# Patient Record
Sex: Female | Born: 1990 | Race: Black or African American | Hispanic: No | Marital: Single | Smoking: Never smoker
Health system: Southern US, Community
[De-identification: ages and names within clinical notes are randomized; demographics above are authoritative.]

## PROBLEM LIST (undated history)

## (undated) DIAGNOSIS — N920 Excessive and frequent menstruation with regular cycle: Secondary | ICD-10-CM

## (undated) HISTORY — DX: Excessive and frequent menstruation with regular cycle: N92.0

## (undated) HISTORY — PX: APPENDECTOMY: SHX54

---

## 2012-03-30 LAB — HM PAP SMEAR

## 2013-04-02 ENCOUNTER — Encounter: Payer: Self-pay | Admitting: Nurse Practitioner

## 2013-04-02 ENCOUNTER — Ambulatory Visit: Payer: Self-pay | Admitting: Nurse Practitioner

## 2013-04-02 DIAGNOSIS — Z01419 Encounter for gynecological examination (general) (routine) without abnormal findings: Secondary | ICD-10-CM

## 2013-05-09 ENCOUNTER — Encounter (HOSPITAL_COMMUNITY): Payer: Self-pay | Admitting: Emergency Medicine

## 2013-05-09 ENCOUNTER — Emergency Department (HOSPITAL_COMMUNITY)
Admission: EM | Admit: 2013-05-09 | Discharge: 2013-05-10 | Disposition: A | Discharge status: Home / Self Care | Payer: BC Managed Care – PPO | Attending: Emergency Medicine | Admitting: Emergency Medicine

## 2013-05-09 ENCOUNTER — Emergency Department (HOSPITAL_COMMUNITY): Payer: BC Managed Care – PPO

## 2013-05-09 DIAGNOSIS — Z9089 Acquired absence of other organs: Secondary | ICD-10-CM | POA: Insufficient documentation

## 2013-05-09 DIAGNOSIS — Z3202 Encounter for pregnancy test, result negative: Secondary | ICD-10-CM | POA: Insufficient documentation

## 2013-05-09 DIAGNOSIS — R195 Other fecal abnormalities: Secondary | ICD-10-CM | POA: Insufficient documentation

## 2013-05-09 DIAGNOSIS — R109 Unspecified abdominal pain: Secondary | ICD-10-CM

## 2013-05-09 DIAGNOSIS — Z91040 Latex allergy status: Secondary | ICD-10-CM | POA: Insufficient documentation

## 2013-05-09 DIAGNOSIS — Z8742 Personal history of other diseases of the female genital tract: Secondary | ICD-10-CM | POA: Insufficient documentation

## 2013-05-09 DIAGNOSIS — R1013 Epigastric pain: Secondary | ICD-10-CM | POA: Insufficient documentation

## 2013-05-09 DIAGNOSIS — Z79899 Other long term (current) drug therapy: Secondary | ICD-10-CM | POA: Insufficient documentation

## 2013-05-09 LAB — CBC WITH DIFFERENTIAL/PLATELET
Basophils Absolute: 0 10*3/uL (ref 0.0–0.1)
Basophils Relative: 1 % (ref 0–1)
Eosinophils Absolute: 0.2 10*3/uL (ref 0.0–0.7)
Eosinophils Relative: 3 % (ref 0–5)
HCT: 41.9 % (ref 36.0–46.0)
Hemoglobin: 14.4 g/dL (ref 12.0–15.0)
Lymphocytes Relative: 48 % — ABNORMAL HIGH (ref 12–46)
Lymphs Abs: 2.9 10*3/uL (ref 0.7–4.0)
MCH: 29.6 pg (ref 26.0–34.0)
MCHC: 34.4 g/dL (ref 30.0–36.0)
MCV: 86 fL (ref 78.0–100.0)
Monocytes Absolute: 0.4 10*3/uL (ref 0.1–1.0)
Monocytes Relative: 7 % (ref 3–12)
Neutro Abs: 2.5 10*3/uL (ref 1.7–7.7)
Neutrophils Relative %: 41 % — ABNORMAL LOW (ref 43–77)
Platelets: 258 10*3/uL (ref 150–400)
RBC: 4.87 MIL/uL (ref 3.87–5.11)
RDW: 12.6 % (ref 11.5–15.5)
WBC: 6 10*3/uL (ref 4.0–10.5)

## 2013-05-09 LAB — COMPREHENSIVE METABOLIC PANEL
ALT: 19 U/L (ref 0–35)
AST: 30 U/L (ref 0–37)
Albumin: 4.6 g/dL (ref 3.5–5.2)
Alkaline Phosphatase: 68 U/L (ref 39–117)
BUN: 17 mg/dL (ref 6–23)
CO2: 25 mEq/L (ref 19–32)
Calcium: 10.3 mg/dL (ref 8.4–10.5)
Chloride: 100 mEq/L (ref 96–112)
Creatinine, Ser: 0.81 mg/dL (ref 0.50–1.10)
GFR calc Af Amer: >90 mL/min (ref >90)
GFR calc non Af Amer: >90 mL/min (ref >90)
Glucose, Bld: 75 mg/dL (ref 70–99)
Potassium: 4.3 mEq/L (ref 3.5–5.1)
Sodium: 137 mEq/L (ref 135–145)
Total Bilirubin: 0.2 mg/dL — ABNORMAL LOW (ref 0.3–1.2)
Total Protein: 8.3 g/dL (ref 6.0–8.3)

## 2013-05-09 LAB — URINALYSIS, ROUTINE W REFLEX MICROSCOPIC
Bilirubin Urine: NEGATIVE
Glucose, UA: NEGATIVE mg/dL
Hgb urine dipstick: NEGATIVE
Ketones, ur: NEGATIVE mg/dL
Leukocytes, UA: NEGATIVE
Nitrite: NEGATIVE
Protein, ur: NEGATIVE mg/dL
Specific Gravity, Urine: 1.017 (ref 1.005–1.030)
Urobilinogen, UA: 0.2 mg/dL (ref 0.0–1.0)
pH: 6 (ref 5.0–8.0)

## 2013-05-09 LAB — POCT PREGNANCY, URINE: Preg Test, Ur: NEGATIVE

## 2013-05-09 LAB — LIPASE, BLOOD: Lipase: 50 U/L (ref 11–59)

## 2013-05-09 MED ORDER — SODIUM CHLORIDE 0.9 % IV SOLN
80.0000 mg | Freq: Once | INTRAVENOUS | Status: AC
  Administered 2013-05-09: 23:00:00 80 mg via INTRAVENOUS
  Filled 2013-05-09: qty 80

## 2013-05-09 MED ORDER — GI COCKTAIL ~~LOC~~
30.0000 mL | Freq: Once | ORAL | Status: AC
  Administered 2013-05-09: 30 mL via ORAL
  Filled 2013-05-09: qty 30

## 2013-05-09 MED ORDER — FAMOTIDINE IN NACL 20-0.9 MG/50ML-% IV SOLN
20.0000 mg | Freq: Once | INTRAVENOUS | Status: AC
  Administered 2013-05-09: 20 mg via INTRAVENOUS
  Filled 2013-05-09: qty 50

## 2013-05-09 NOTE — ED Provider Notes (Signed)
CSN: [id number]     Arrival date & time 05/09/13  2124 History   First MD Initiated Contact with Patient 05/09/13 2143     Chief Complaint  Patient presents with  . Abdominal Pain   (Consider location/radiation/quality/duration/timing/severity/associated sxs/prior Treatment) HPI This 22 year old female is 3 days of intermittent epigastric pain, several times per hour during the daytime and nighttime she will have some intermittent sharp stabbing burning epigastric pains lasting less than a minute at a time but has multiple episodes per hour, and she will try an antacid and she will have no pain whatsoever for a few hours, she's had some very dark stools for the last few days as well, she has only mild epigastric pain now within the last hour, she is no nausea vomiting diarrhea or grossly bloody stools, she is no dysuria or vaginal discharge, she is on her menstrual cycle which is a normal period for her. She has had prior appendectomy. She has no lower abdominal pain. She is no trauma fever rash chest pain shortness breath. She is no treatment prior to arrival other than over-the-counter antacids which has helped the pain temporarily.The triage note had confusion written down his complaint the patient states she really has had no confusion. Past Medical History  Diagnosis Date  . Menorrhagia    Past Surgical History  Procedure Laterality Date  . Appendectomy     Family History  Problem Relation Age of Onset  . Asthma Mother    History  Substance Use Topics  . Smoking status: Never Smoker   . Smokeless tobacco: Never Used  . Alcohol Use: No   OB History   Grav Para Term Preterm Abortions TAB SAB Ect Mult Living   0 0 0 0 0 0 0 0 0 0      Review of Systems 10 Systems reviewed and are negative for acute change except as noted in the HPI. Allergies  Latex  Home Medications   Current Outpatient Rx  Name  Route  Sig  Dispense  Refill  . omeprazole (PRILOSEC) 20 MG capsule    Oral   Take 1 capsule (20 mg total) by mouth daily.   15 capsule   0    BP 104/68  Pulse 69  Temp(Src) 98.5 F (36.9 C) (Oral)  Resp 18  SpO2 100%  LMP 05/09/2013 Physical Exam  Nursing note and vitals reviewed. Constitutional:  Awake, alert, nontoxic appearance.  HENT:  Head: Atraumatic.  Eyes: Right eye exhibits no discharge. Left eye exhibits no discharge.  Neck: Neck supple.  Cardiovascular: Normal rate and regular rhythm.   No murmur heard. Pulmonary/Chest: Effort normal and breath sounds normal. No respiratory distress. She has no wheezes. She has no rales. She exhibits no tenderness.  Abdominal: Soft. Bowel sounds are normal. She exhibits no distension and no mass. There is tenderness. There is no rebound and no guarding.  Minimal epigastric tenderness only the rest of the abdomen is nontender, there is no CVA tenderness  Musculoskeletal: She exhibits no tenderness.  Baseline ROM, no obvious new focal weakness.  Neurological:  Mental status and motor strength appears baseline for patient and situation.  Skin: No rash noted.  Psychiatric: She has a normal mood and affect.    ED Course  Procedures (including critical care time) Pt stable in ED with no significant deterioration in condition.Patient / Family / Caregiver informed of clinical course, understand medical decision-making process, and agree with plan. Labs Review Labs Reviewed  CBC WITH DIFFERENTIAL -  Abnormal; Notable for the following:    Neutrophils Relative % 41 (*)    Lymphocytes Relative 48 (*)    All other components within normal limits  COMPREHENSIVE METABOLIC PANEL - Abnormal; Notable for the following:    Total Bilirubin 0.2 (*)    All other components within normal limits  LIPASE, BLOOD  URINALYSIS, ROUTINE W REFLEX MICROSCOPIC  POCT PREGNANCY, URINE  OCCULT BLOOD, POC DEVICE   Imaging Review No results found.  EKG Interpretation   None       MDM   1. Abdominal pain    I doubt  any other EMC precluding discharge at this time including, but not necessarily limited to the following:SBI, peritonitis.    Hurman Horn, MD 05/12/13 1630

## 2013-05-09 NOTE — ED Notes (Signed)
Pt states she has been having abdominal pain x3 days and is having dark stools, back pain with slight confusion. Pt reports when pain in abdomen occurs "it feels like a pulling then release sensation."

## 2013-05-10 LAB — OCCULT BLOOD, POC DEVICE: Fecal Occult Bld: NEGATIVE

## 2013-05-10 MED ORDER — OMEPRAZOLE 20 MG PO CPDR: 20.0000 mg | DELAYED_RELEASE_CAPSULE | Freq: Every day | ORAL | Status: DC

## 2013-05-19 ENCOUNTER — Other Ambulatory Visit: Payer: Self-pay | Admitting: Obstetrics and Gynecology

## 2013-06-25 ENCOUNTER — Encounter: Payer: Self-pay | Admitting: Certified Nurse Midwife

## 2013-06-25 ENCOUNTER — Ambulatory Visit (INDEPENDENT_AMBULATORY_CARE_PROVIDER_SITE_OTHER): Payer: BC Managed Care – PPO | Admitting: Certified Nurse Midwife

## 2013-06-25 VITALS — BP 106/64 | HR 64 | Resp 16 | Ht 66.5 in | Wt 133.0 lb

## 2013-06-25 DIAGNOSIS — B3731 Acute candidiasis of vulva and vagina: Secondary | ICD-10-CM

## 2013-06-25 DIAGNOSIS — N760 Acute vaginitis: Secondary | ICD-10-CM

## 2013-06-25 DIAGNOSIS — B373 Candidiasis of vulva and vagina: Secondary | ICD-10-CM

## 2013-06-25 DIAGNOSIS — Z91040 Latex allergy status: Secondary | ICD-10-CM

## 2013-06-25 MED ORDER — METRONIDAZOLE 0.75 % VA GEL: 1.0000 | Freq: Every day | VAGINAL | Status: DC

## 2013-06-25 MED ORDER — NYSTATIN-TRIAMCINOLONE 100000-0.1 UNIT/GM-% EX CREA: 1.0000 "application " | TOPICAL_CREAM | Freq: Two times a day (BID) | CUTANEOUS | Status: DC

## 2013-06-25 NOTE — Patient Instructions (Signed)
Bacterial Vaginosis Bacterial vaginosis (BV) is a vaginal infection where the normal balance of bacteria in the vagina is disrupted. The normal balance is then replaced by an overgrowth of certain bacteria. There are several different kinds of bacteria that can cause BV. BV is the most common vaginal infection in women of childbearing age. CAUSES   The cause of BV is not fully understood. BV develops when there is an increase or imbalance of harmful bacteria.  Some activities or behaviors can upset the normal balance of bacteria in the vagina and put women at increased risk including:  Having a new sex partner or multiple sex partners.  Douching.  Using an intrauterine device (IUD) for contraception.  It is not clear what role sexual activity plays in the development of BV. However, women that have never had sexual intercourse are rarely infected with BV. Women do not get BV from toilet seats, bedding, swimming pools or from touching objects around them.  SYMPTOMS   Grey vaginal discharge.  A fish-like odor with discharge, especially after sexual intercourse.  Itching or burning of the vagina and vulva.  Burning or pain with urination.  Some women have no signs or symptoms at all. DIAGNOSIS  Your caregiver must examine the vagina for signs of BV. Your caregiver will perform lab tests and look at the sample of vaginal fluid through a microscope. They will look for bacteria and abnormal cells (clue cells), a pH test higher than 4.5, and a positive amine test all associated with BV.  RISKS AND COMPLICATIONS   Pelvic inflammatory disease (PID).  Infections following gynecology surgery.  Developing HIV.  Developing herpes virus. TREATMENT  Sometimes BV will clear up without treatment. However, all women with symptoms of BV should be treated to avoid complications, especially if gynecology surgery is planned. Female partners generally do not need to be treated. However, BV may spread  between female sex partners so treatment is helpful in preventing a recurrence of BV.   BV may be treated with antibiotics. The antibiotics come in either pill or vaginal cream forms. Either can be used with nonpregnant or pregnant women, but the recommended dosages differ. These antibiotics are not harmful to the baby.  BV can recur after treatment. If this happens, a second round of antibiotics will often be prescribed.  Treatment is important for pregnant women. If not treated, BV can cause a premature delivery, especially for a pregnant woman who had a premature birth in the past. All pregnant women who have symptoms of BV should be checked and treated.  For chronic reoccurrence of BV, treatment with a type of prescribed gel vaginally twice a week is helpful. HOME CARE INSTRUCTIONS   Finish all medication as directed by your caregiver.  Do not have sex until treatment is completed.  Tell your sexual partner that you have a vaginal infection. They should see their caregiver and be treated if they have problems, such as a mild rash or itching.  Practice safe sex. Use condoms. Only have 1 sex partner. PREVENTION  Basic prevention steps can help reduce the risk of upsetting the natural balance of bacteria in the vagina and developing BV:  Do not have sexual intercourse (be abstinent).  Do not douche.  Use all of the medicine prescribed for treatment of BV, even if the signs and symptoms go away.  Tell your sex partner if you have BV. That way, they can be treated, if needed, to prevent reoccurrence. SEEK MEDICAL CARE IF:     Your symptoms are not improving after 3 days of treatment.  You have increased discharge, pain, or fever. MAKE SURE YOU:   Understand these instructions.  Will watch your condition.  Will get help right away if you are not doing well or get worse. FOR MORE INFORMATION  Division of STD Prevention (DSTDP), Centers for Disease Control and Prevention:  www.cdc.gov/std American Social Health Association (ASHA): www.ashastd.org  Document Released: 06/03/2005 Document Revised: 08/26/2011 Document Reviewed: 01/13/2013 Exit[employer] Patient Information 2014 [employer].  

## 2013-06-25 NOTE — Progress Notes (Signed)
23 y.o.[place] female [place] with a 7 day(s) history of the following:urinary symptoms of none and vulvar itching Sexually active: yes Last sexual activity:6days ago. Pt also reports the following associated symptoms: none Patient hastried over the counter treatment with minimal relief. Patient has been using Vagisil cream with some response. No new personal products, but had not been sexually active in a while and used condom. Denies STD concern or testing needed.  O: Healthy female WDWN Affect normal     Exam:  KNL:[id number], Urethra, Skene's normal, increase pink noted on vulva with exudate, non tender, no lesions noted.  wet prep taken                Vag:no lesions, discharge: copious, watery and malodorous, pH 5.0, wet prep done                Cx:  normal appearance and non tender                Uterus:non-tender, normal shape and consistency                Adnexa: normal adnexa and no mass, fullness, tenderness  Wet Prep shows:positive for yeast on vulva only, positive for BV vaginally, negative for trich   A: BV Yeast vulvitis ?latex allergy response to condom  P: Reviewed findings of BV and may be response to latex use also. Not considered sexually transmitted disease. Rx Metrogel see order Rx Mycolog see order Discussed use of non latex condoms to prevent severe allergic response.  Symptomatic local care discussed. Scientist, clinical (histocompatibility and immunogenetics) distributed.  RV prn

## 2013-06-28 NOTE — Progress Notes (Signed)
Reviewed personally.  M. Suzanne Noris Kulinski, MD.  

## 2014-05-30 ENCOUNTER — Encounter (HOSPITAL_COMMUNITY): Payer: Self-pay | Admitting: Emergency Medicine

## 2014-05-30 ENCOUNTER — Emergency Department (HOSPITAL_COMMUNITY)
Admission: EM | Admit: 2014-05-30 | Discharge: 2014-05-30 | Disposition: A | Discharge status: Home / Self Care | Payer: BC Managed Care – PPO | Attending: Emergency Medicine | Admitting: Emergency Medicine

## 2014-05-30 DIAGNOSIS — Z8742 Personal history of other diseases of the female genital tract: Secondary | ICD-10-CM | POA: Insufficient documentation

## 2014-05-30 DIAGNOSIS — G8929 Other chronic pain: Secondary | ICD-10-CM | POA: Insufficient documentation

## 2014-05-30 DIAGNOSIS — Y9389 Activity, other specified: Secondary | ICD-10-CM | POA: Diagnosis not present

## 2014-05-30 DIAGNOSIS — S0990XA Unspecified injury of head, initial encounter: Secondary | ICD-10-CM | POA: Insufficient documentation

## 2014-05-30 DIAGNOSIS — Z91040 Latex allergy status: Secondary | ICD-10-CM | POA: Insufficient documentation

## 2014-05-30 DIAGNOSIS — S199XXA Unspecified injury of neck, initial encounter: Secondary | ICD-10-CM | POA: Diagnosis present

## 2014-05-30 DIAGNOSIS — Y998 Other external cause status: Secondary | ICD-10-CM | POA: Insufficient documentation

## 2014-05-30 DIAGNOSIS — Y92410 Unspecified street and highway as the place of occurrence of the external cause: Secondary | ICD-10-CM | POA: Insufficient documentation

## 2014-05-30 DIAGNOSIS — S3992XA Unspecified injury of lower back, initial encounter: Secondary | ICD-10-CM | POA: Diagnosis not present

## 2014-05-30 MED ORDER — NAPROXEN 500 MG PO TABS: 500.0000 mg | ORAL_TABLET | Freq: Two times a day (BID) | ORAL | Status: DC

## 2014-05-30 MED ORDER — METHOCARBAMOL 500 MG PO TABS: 500.0000 mg | ORAL_TABLET | Freq: Two times a day (BID) | ORAL | Status: DC
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
## 2014-06-07 ENCOUNTER — Emergency Department (INDEPENDENT_AMBULATORY_CARE_PROVIDER_SITE_OTHER)
Admission: EM | Admit: 2014-06-07 | Discharge: 2014-06-07 | Disposition: A | Discharge status: Home / Self Care | Payer: Self-pay | Source: Home / Self Care | Attending: Family Medicine | Admitting: Family Medicine

## 2014-06-07 ENCOUNTER — Encounter (HOSPITAL_COMMUNITY): Payer: Self-pay | Admitting: Emergency Medicine

## 2014-06-07 DIAGNOSIS — Z041 Encounter for examination and observation following transport accident: Secondary | ICD-10-CM

## 2014-06-07 MED ORDER — MELOXICAM 7.5 MG PO TABS: 7.5000 mg | ORAL_TABLET | Freq: Two times a day (BID) | ORAL | Status: DC

## 2014-06-07 MED ORDER — METAXALONE 800 MG PO TABS: 800.0000 mg | ORAL_TABLET | Freq: Three times a day (TID) | ORAL | Status: DC
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
## 2014-06-07 NOTE — Discharge Instructions (Signed)
Heat and medicine and care as advised, see orthopedist if further problems.

## 2014-07-01 ENCOUNTER — Encounter: Payer: Self-pay | Admitting: Certified Nurse Midwife

## 2014-08-11 ENCOUNTER — Encounter: Payer: Self-pay | Admitting: Certified Nurse Midwife

## 2014-09-15 ENCOUNTER — Encounter (HOSPITAL_COMMUNITY): Payer: Self-pay | Admitting: Emergency Medicine

## 2014-09-15 ENCOUNTER — Emergency Department (HOSPITAL_COMMUNITY)
Admission: EM | Admit: 2014-09-15 | Discharge: 2014-09-15 | Disposition: A | Discharge status: Home / Self Care | Payer: BLUE CROSS/BLUE SHIELD | Attending: Emergency Medicine | Admitting: Emergency Medicine

## 2014-09-15 DIAGNOSIS — Z23 Encounter for immunization: Secondary | ICD-10-CM | POA: Insufficient documentation

## 2014-09-15 DIAGNOSIS — Z791 Long term (current) use of non-steroidal anti-inflammatories (NSAID): Secondary | ICD-10-CM | POA: Diagnosis not present

## 2014-09-15 DIAGNOSIS — Z8742 Personal history of other diseases of the female genital tract: Secondary | ICD-10-CM | POA: Insufficient documentation

## 2014-09-15 DIAGNOSIS — S61451A Open bite of right hand, initial encounter: Secondary | ICD-10-CM | POA: Insufficient documentation

## 2014-09-15 DIAGNOSIS — Z79899 Other long term (current) drug therapy: Secondary | ICD-10-CM | POA: Insufficient documentation

## 2014-09-15 DIAGNOSIS — Y9389 Activity, other specified: Secondary | ICD-10-CM | POA: Insufficient documentation

## 2014-09-15 DIAGNOSIS — S61452A Open bite of left hand, initial encounter: Secondary | ICD-10-CM | POA: Diagnosis not present

## 2014-09-15 DIAGNOSIS — Y998 Other external cause status: Secondary | ICD-10-CM | POA: Diagnosis not present

## 2014-09-15 DIAGNOSIS — W540XXA Bitten by dog, initial encounter: Secondary | ICD-10-CM | POA: Diagnosis not present

## 2014-09-15 DIAGNOSIS — Z91040 Latex allergy status: Secondary | ICD-10-CM | POA: Insufficient documentation

## 2014-09-15 DIAGNOSIS — Y929 Unspecified place or not applicable: Secondary | ICD-10-CM | POA: Insufficient documentation

## 2014-09-15 MED ORDER — AMOXICILLIN-POT CLAVULANATE 875-125 MG PO TABS: 1.0000 | ORAL_TABLET | Freq: Two times a day (BID) | ORAL | Status: DC

## 2014-09-15 MED ORDER — TETANUS-DIPHTH-ACELL PERTUSSIS 5-2.5-18.5 LF-MCG/0.5 IM SUSP
0.5000 mL | Freq: Once | INTRAMUSCULAR | Status: AC
  Administered 2014-09-15: 0.5 mL via INTRAMUSCULAR
  Filled 2014-09-15: qty 0.5

## 2014-09-15 NOTE — ED Notes (Signed)
Patient here after being bitten by her personal dog. States that she was attempting to bath him and he snapped at her twice injuring both her left and right hands. Patient has small laceration between pointer and middle finger of left hand, and small lacerations to right thumb. Patient states dog is up to date on all shots.

## 2014-09-15 NOTE — ED Provider Notes (Signed)
CSN: [id number]     Arrival date & time 09/15/14  1945 History   First MD Initiated Contact with Patient 09/15/14 2018     Chief Complaint  Patient presents with  . Animal Bite     (Consider location/radiation/quality/duration/timing/severity/associated sxs/prior Treatment) HPI Comments:  24 year old female presenting with a dog bite to both of her hands after being bit by her personal dog this evening. States she was attempting to bathe him and he snapped at her twice. This is the third time he has bit her. States she applied triple antibiotic ointment to the wounds. The dog is fully vaccinated, and up-to-date on his rabies vaccine. States the lacerations are stinging and painful. Unknown last tetanus shot, states it is greater than 5 years ago.  Patient is a 24 y.o. female presenting with animal bite. The history is provided by the patient.  Animal Bite   Past Medical History  Diagnosis Date  . Menorrhagia    Past Surgical History  Procedure Laterality Date  . Appendectomy     Family History  Problem Relation Age of Onset  . Asthma Mother    History  Substance Use Topics  . Smoking status: Never Smoker   . Smokeless tobacco: Never Used  . Alcohol Use: No   OB History    Gravida Para Term Preterm AB TAB SAB Ectopic Multiple Living   0 0 0 0 0 0 0 0 0 0      Review of Systems  Skin: Positive for wound.  All other systems reviewed and are negative.     Allergies  Latex  Home Medications   Prior to Admission medications   Medication Sig Start Date End Date Taking? Authorizing Provider  amoxicillin-clavulanate (AUGMENTIN) 875-125 MG per tablet Take 1 tablet by mouth 2 (two) times daily. One po bid x 7 days 09/15/14   Carman Ching, PA-C  meloxicam (MOBIC) 7.5 MG tablet Take 1 tablet (7.5 mg total) by mouth 2 (two) times daily after a meal. 06/07/14   Billy Fischer, MD  metaxalone (SKELAXIN) 800 MG tablet Take 1 tablet (800 mg total) by mouth 3 (three) times daily. As  muscle relaxer 06/07/14   Billy Fischer, MD  methocarbamol (ROBAXIN) 500 MG tablet Take 1 tablet (500 mg total) by mouth 2 (two) times daily. 05/30/14   Heather Laisure, PA-C  metroNIDAZOLE (METROGEL) 0.75 % vaginal gel Place 1 Applicatorful vaginally at bedtime. For 5 nights 06/25/13   Regina Eck, CNM  naproxen (NAPROSYN) 500 MG tablet Take 1 tablet (500 mg total) by mouth 2 (two) times daily. 05/30/14   Hyman Bible, PA-C  nystatin-triamcinolone (MYCOLOG II) cream Apply 1 application topically 2 (two) times daily. Apply to affected area BID for up to 7 days. 06/25/13   Regina Eck, CNM   BP 131/85 mmHg  Pulse 103  Temp(Src) 99.2 F (37.3 C)  Resp 18  Wt 134 lb (60.782 kg)  SpO2 100%  LMP 09/14/2014 (Exact Date) Physical Exam  Constitutional: She is oriented to person, place, and time. She appears well-developed and well-nourished. No distress.  HENT:  Head: Normocephalic and atraumatic.  Mouth/Throat: Oropharynx is clear and moist.  Eyes: Conjunctivae and EOM are normal.  Neck: Normal range of motion. Neck supple.  Cardiovascular: Normal rate, regular rhythm and normal heart sounds.   Pulmonary/Chest: Effort normal and breath sounds normal. No respiratory distress.  Musculoskeletal: Normal range of motion. She exhibits no edema.  1 cm laceration through epidermis only of  web spaces between left index and middle finger. 1 cm superficial laceration to right thumb next to nail. No active bleeding. FROM bilateral hands, pain with movement near lacerations. Cap refill < 3 seconds.  Neurological: She is alert and oriented to person, place, and time. No sensory deficit.  Skin: Skin is warm and dry.  Psychiatric: She has a normal mood and affect. Her behavior is normal.  Nursing note and vitals reviewed.   ED Course  Procedures (including critical care time) Labs Review Labs Reviewed - No data to display  Imaging Review No results found.   EKG Interpretation None       MDM   Final diagnoses:  Dog bite, hand, left, initial encounter  Dog bite, hand, right, initial encounter    Neurovascularly intact. Vital signs stable. No tachycardia and my exam. Lacerations are not deep. Wound care given. Tdap updated. Rx Augmentin.  Animal control closed at this time, discussed with hospital officer who will try to contact animal control  Given this is the third time dog has bit her. Stable for discharge. Return precautions given. Patient states understanding of treatment care plan and is agreeable.  Carman Ching, PA-C 09/15/14 2059  Dorie Rank, MD 09/17/14 [phone]

## 2014-09-15 NOTE — Discharge Instructions (Signed)
Take Augmentin twice daily for 1 week. It is important to take the entire course of this antibiotic.  Animal Bite An animal bite can result in a scratch on the skin, deep open cut, puncture of the skin, crush injury, or tearing away of the skin or a body part. Dogs are responsible for most animal bites. Children are bitten more often than adults. An animal bite can range from very mild to more serious. A small bite from your house pet is no cause for alarm. However, some animal bites can become infected or injure a bone or other tissue. You must seek medical care if:  The skin is broken and bleeding does not slow down or stop after 15 minutes.  The puncture is deep and difficult to clean (such as a cat bite).  Pain, warmth, redness, or pus develops around the wound.  The bite is from a stray animal or rodent. There may be a risk of rabies infection.  The bite is from a snake, raccoon, skunk, fox, coyote, or bat. There may be a risk of rabies infection.  The person bitten has a chronic illness such as diabetes, liver disease, or cancer, or the person takes medicine that lowers the immune system.  There is concern about the location and severity of the bite. It is important to clean and protect an animal bite wound right away to prevent infection. Follow these steps:  Clean the wound with plenty of water and soap.  Apply an antibiotic cream.  Apply gentle pressure over the wound with a clean towel or gauze to slow or stop bleeding.  Elevate the affected area above the heart to help stop any bleeding.  Seek medical care. Getting medical care within 8 hours of the animal bite leads to the best possible outcome. DIAGNOSIS  Your caregiver will most likely:  Take a detailed history of the animal and the bite injury.  Perform a wound exam.  Take your medical history. Blood tests or X-rays may be performed. Sometimes, infected bite wounds are cultured and sent to a lab to identify the  infectious bacteria.  TREATMENT  Medical treatment will depend on the location and type of animal bite as well as the patient's medical history. Treatment may include:  Wound care, such as cleaning and flushing the wound with saline solution, bandaging, and elevating the affected area.  Antibiotics.  Tetanus immunization.  Rabies immunization.  Leaving the wound open to heal. This is often done with animal bites, due to the high risk of infection. However, in certain cases, wound closure with stitches, wound adhesive, skin adhesive strips, or staples may be used. Infected bites that are left untreated may require intravenous (IV) antibiotics and surgical treatment in the hospital. [place]  Follow your caregiver's instructions for wound care.  Take all medicines as directed.  If your caregiver prescribes antibiotics, take them as directed. Finish them even if you start to feel better.  Follow up with your caregiver for further exams or immunizations as directed. You may need a tetanus shot if:  You cannot remember when you had your last tetanus shot.  You have never had a tetanus shot.  The injury broke your skin. If you get a tetanus shot, your arm may swell, get red, and feel warm to the touch. This is common and not a problem. If you need a tetanus shot and you choose not to have one, there is a rare chance of getting tetanus. Sickness from tetanus  can be serious. SEEK MEDICAL CARE IF:  You notice warmth, redness, soreness, swelling, pus discharge, or a bad smell coming from the wound.  You have a red line on the skin coming from the wound.  You have a fever, chills, or a general ill feeling.  You have nausea or vomiting.  You have continued or worsening pain.  You have trouble moving the injured part.  You have other questions or concerns. MAKE SURE YOU:  Understand these instructions.  Will watch your condition.  Will get help right away if you  are not doing well or get worse. Document Released: 02/19/2011 Document Revised: 08/26/2011 Document Reviewed: 02/19/2011 [facility] Patient Information 2015 [place], [place]. This information is not intended to replace advice given to you by your health care provider. Make sure you discuss any questions you have with your health care provider.

## 2019-06-09 ENCOUNTER — Inpatient Hospital Stay (HOSPITAL_COMMUNITY): Payer: Self-pay

## 2019-06-09 ENCOUNTER — Other Ambulatory Visit: Payer: Self-pay

## 2019-06-09 ENCOUNTER — Encounter (HOSPITAL_COMMUNITY): Payer: Self-pay | Admitting: Obstetrics and Gynecology

## 2019-06-09 ENCOUNTER — Inpatient Hospital Stay (HOSPITAL_COMMUNITY)
Admission: AD | Admit: 2019-06-09 | Discharge: 2019-06-09 | Disposition: A | Discharge status: Home / Self Care | Payer: Self-pay | Attending: Obstetrics and Gynecology | Admitting: Obstetrics and Gynecology

## 2019-06-09 DIAGNOSIS — Z3A01 Less than 8 weeks gestation of pregnancy: Secondary | ICD-10-CM | POA: Insufficient documentation

## 2019-06-09 DIAGNOSIS — D259 Leiomyoma of uterus, unspecified: Secondary | ICD-10-CM | POA: Insufficient documentation

## 2019-06-09 DIAGNOSIS — O209 Hemorrhage in early pregnancy, unspecified: Secondary | ICD-10-CM

## 2019-06-09 DIAGNOSIS — R109 Unspecified abdominal pain: Secondary | ICD-10-CM | POA: Insufficient documentation

## 2019-06-09 DIAGNOSIS — Z91040 Latex allergy status: Secondary | ICD-10-CM | POA: Insufficient documentation

## 2019-06-09 DIAGNOSIS — O3411 Maternal care for benign tumor of corpus uteri, first trimester: Secondary | ICD-10-CM | POA: Insufficient documentation

## 2019-06-09 DIAGNOSIS — O3680X0 Pregnancy with inconclusive fetal viability, not applicable or unspecified: Secondary | ICD-10-CM | POA: Insufficient documentation

## 2019-06-09 DIAGNOSIS — Z792 Long term (current) use of antibiotics: Secondary | ICD-10-CM | POA: Insufficient documentation

## 2019-06-09 DIAGNOSIS — O26899 Other specified pregnancy related conditions, unspecified trimester: Secondary | ICD-10-CM

## 2019-06-09 DIAGNOSIS — O99891 Other specified diseases and conditions complicating pregnancy: Secondary | ICD-10-CM | POA: Insufficient documentation

## 2019-06-09 LAB — CBC WITH DIFFERENTIAL/PLATELET
Abs Immature Granulocytes: 0.02 10*3/uL (ref 0.00–0.07)
Basophils Absolute: 0 10*3/uL (ref 0.0–0.1)
Basophils Relative: 1 %
Eosinophils Absolute: 0.1 10*3/uL (ref 0.0–0.5)
Eosinophils Relative: 1 %
HCT: 38.5 % (ref 36.0–46.0)
Hemoglobin: 12.8 g/dL (ref 12.0–15.0)
Immature Granulocytes: 0 %
Lymphocytes Relative: 23 %
Lymphs Abs: 1.4 10*3/uL (ref 0.7–4.0)
MCH: 29.2 pg (ref 26.0–34.0)
MCHC: 33.2 g/dL (ref 30.0–36.0)
MCV: 87.9 fL (ref 80.0–100.0)
Monocytes Absolute: 0.5 10*3/uL (ref 0.1–1.0)
Monocytes Relative: 8 %
Neutro Abs: 4.2 10*3/uL (ref 1.7–7.7)
Neutrophils Relative %: 67 %
Platelets: 302 10*3/uL (ref 150–400)
RBC: 4.38 MIL/uL (ref 3.87–5.11)
RDW: 13.1 % (ref 11.5–15.5)
WBC: 6.3 10*3/uL (ref 4.0–10.5)
nRBC: 0 % (ref 0.0–0.2)

## 2019-06-09 LAB — URINALYSIS, ROUTINE W REFLEX MICROSCOPIC
Bilirubin Urine: NEGATIVE
Glucose, UA: NEGATIVE mg/dL
Hgb urine dipstick: NEGATIVE
Ketones, ur: NEGATIVE mg/dL
Leukocytes,Ua: NEGATIVE
Nitrite: NEGATIVE
Protein, ur: NEGATIVE mg/dL
Specific Gravity, Urine: 1.004 — ABNORMAL LOW (ref 1.005–1.030)
pH: 6 (ref 5.0–8.0)

## 2019-06-09 LAB — HCG, QUANTITATIVE, PREGNANCY: hCG, Beta Chain, Quant, S: 701 m[IU]/mL — ABNORMAL HIGH (ref <5)

## 2019-06-09 LAB — COMPREHENSIVE METABOLIC PANEL
ALT: 15 U/L (ref 0–44)
AST: 15 U/L (ref 15–41)
Albumin: 4 g/dL (ref 3.5–5.0)
Alkaline Phosphatase: 42 U/L (ref 38–126)
Anion gap: 8 (ref 5–15)
BUN: 7 mg/dL (ref 6–20)
CO2: 24 mmol/L (ref 22–32)
Calcium: 9.5 mg/dL (ref 8.9–10.3)
Chloride: 106 mmol/L (ref 98–111)
Creatinine, Ser: 0.84 mg/dL (ref 0.44–1.00)
GFR calc Af Amer: >60 mL/min (ref >60)
GFR calc non Af Amer: >60 mL/min (ref >60)
Glucose, Bld: 91 mg/dL (ref 70–99)
Potassium: 3.8 mmol/L (ref 3.5–5.1)
Sodium: 138 mmol/L (ref 135–145)
Total Bilirubin: 1 mg/dL (ref 0.3–1.2)
Total Protein: 7.4 g/dL (ref 6.5–8.1)

## 2019-06-09 LAB — WET PREP, GENITAL
Clue Cells Wet Prep HPF POC: NONE SEEN
Sperm: NONE SEEN
Trich, Wet Prep: NONE SEEN
Yeast Wet Prep HPF POC: NONE SEEN

## 2019-06-09 LAB — ABO/RH: ABO/RH(D): B POS

## 2019-06-09 LAB — POCT PREGNANCY, URINE: Preg Test, Ur: POSITIVE — AB

## 2019-06-09 NOTE — MAU Provider Note (Signed)
History     CSN: [id number]  Arrival date and time: 06/09/19 1308   First Provider Initiated Contact with Patient 06/09/19 1349      Chief Complaint  Patient presents with  . Vaginal Bleeding  . Abdominal Pain   HPI   [name] is a 28 y.o.female G1P0000 @ [redacted]w[redacted]d here with vaginal bleeding and abdominal pain. The bleeding started on Saturday 5 hours after intercourse. She started having abdominal pain on Monday. The pain comes and goes and seem's to be worse in the evenings. She has not taken anything for the pain. The bleeding is light, some small clots were noted. Bleeding is light at this time.   OB History    Gravida  1   Para  0   Term  0   Preterm  0   AB  0   Living  0     SAB  0   TAB  0   Ectopic  0   Multiple  0   Live Births              Past Medical History:  Diagnosis Date  . Menorrhagia     Past Surgical History:  Procedure Laterality Date  . APPENDECTOMY      Family History  Problem Relation Age of Onset  . Asthma Mother     Social History   Tobacco Use  . Smoking status: Never Smoker  . Smokeless tobacco: Never Used  Substance Use Topics  . Alcohol use: No    Comment: last drink 06/04/19  . Drug use: No    Allergies:  Allergies  Allergen Reactions  . Latex Itching    & burning    Medications Prior to Admission  Medication Sig Dispense Refill Last Dose  . amoxicillin-clavulanate (AUGMENTIN) 875-125 MG per tablet Take 1 tablet by mouth 2 (two) times daily. One po bid x 7 days 14 tablet 0   . meloxicam (MOBIC) 7.5 MG tablet Take 1 tablet (7.5 mg total) by mouth 2 (two) times daily after a meal. (Patient not taking: Reported on 09/15/2014) 30 tablet 1   . metaxalone (SKELAXIN) 800 MG tablet Take 1 tablet (800 mg total) by mouth 3 (three) times daily. As muscle relaxer (Patient not taking: Reported on 09/15/2014) 21 tablet 0   . methocarbamol (ROBAXIN) 500 MG tablet Take 1 tablet (500 mg total) by mouth 2 (two) times  daily. (Patient not taking: Reported on 09/15/2014) 20 tablet 0   . metroNIDAZOLE (METROGEL) 0.75 % vaginal gel Place 1 Applicatorful vaginally at bedtime. For 5 nights (Patient not taking: Reported on 09/15/2014) 70 g 0   . naproxen (NAPROSYN) 500 MG tablet Take 1 tablet (500 mg total) by mouth 2 (two) times daily. (Patient not taking: Reported on 09/15/2014) 30 tablet 0   . nystatin-triamcinolone (MYCOLOG II) cream Apply 1 application topically 2 (two) times daily. Apply to affected area BID for up to 7 days. (Patient not taking: Reported on 09/15/2014) 30 g 0    Results for orders placed or performed during the hospital encounter of 06/09/19 (from the past 48 hour(s))  Pregnancy, urine POC     Status: Abnormal   Collection Time: 06/09/19  1:24 PM  Result Value Ref Range   Preg Test, Ur POSITIVE (A) NEGATIVE    Comment:        THE SENSITIVITY OF THIS METHODOLOGY IS >24 mIU/mL   Urinalysis, Routine w reflex microscopic     Status: Abnormal  Collection Time: 06/09/19  1:28 PM  Result Value Ref Range   Color, Urine STRAW (A) YELLOW   APPearance CLEAR CLEAR   Specific Gravity, Urine 1.004 (L) 1.005 - 1.030   pH 6.0 5.0 - 8.0   Glucose, UA NEGATIVE NEGATIVE mg/dL   Hgb urine dipstick NEGATIVE NEGATIVE   Bilirubin Urine NEGATIVE NEGATIVE   Ketones, ur NEGATIVE NEGATIVE mg/dL   Protein, ur NEGATIVE NEGATIVE mg/dL   Nitrite NEGATIVE NEGATIVE   Leukocytes,Ua NEGATIVE NEGATIVE    Comment: Performed at [facility] [address] [place], [place] [address]  CBC with Differential/Platelet     Status: None   Collection Time: 06/09/19  2:07 PM  Result Value Ref Range   WBC 6.3 4.0 - 10.5 K/uL   RBC 4.38 3.87 - 5.11 MIL/uL   Hemoglobin 12.8 12.0 - 15.0 g/dL   HCT 38.5 36.0 - 46.0 %   MCV 87.9 80.0 - 100.0 fL   MCH 29.2 26.0 - 34.0 pg   MCHC 33.2 30.0 - 36.0 g/dL   RDW 13.1 11.5 - 15.5 %   Platelets 302 150 - 400 K/uL   nRBC 0.0 0.0 - 0.2 %   Neutrophils Relative % 67 %   Neutro Abs  4.2 1.7 - 7.7 K/uL   Lymphocytes Relative 23 %   Lymphs Abs 1.4 0.7 - 4.0 K/uL   Monocytes Relative 8 %   Monocytes Absolute 0.5 0.1 - 1.0 K/uL   Eosinophils Relative 1 %   Eosinophils Absolute 0.1 0.0 - 0.5 K/uL   Basophils Relative 1 %   Basophils Absolute 0.0 0.0 - 0.1 K/uL   Immature Granulocytes 0 %   Abs Immature Granulocytes 0.02 0.00 - 0.07 K/uL    Comment: Performed at [facility], [address], [place], [place] [address]  Comprehensive metabolic panel     Status: None   Collection Time: 06/09/19  2:07 PM  Result Value Ref Range   Sodium 138 135 - 145 mmol/L   Potassium 3.8 3.5 - 5.1 mmol/L   Chloride 106 98 - 111 mmol/L   CO2 24 22 - 32 mmol/L   Glucose, Bld 91 70 - 99 mg/dL   BUN 7 6 - 20 mg/dL   Creatinine, Ser 0.84 0.44 - 1.00 mg/dL   Calcium 9.5 8.9 - 10.3 mg/dL   Total Protein 7.4 6.5 - 8.1 g/dL   Albumin 4.0 3.5 - 5.0 g/dL   AST 15 15 - 41 U/L   ALT 15 0 - 44 U/L   Alkaline Phosphatase 42 38 - 126 U/L   Total Bilirubin 1.0 0.3 - 1.2 mg/dL   GFR calc non Af Amer >60 >60 mL/min   GFR calc Af Amer >60 >60 mL/min   Anion gap 8 5 - 15    Comment: Performed at [facility], [place] [address] [place], [place] [address]  ABO/Rh     Status: None   Collection Time: 06/09/19  2:07 PM  Result Value Ref Range   ABO/RH(D) B POS    No rh immune globuloin      NOT A RH IMMUNE GLOBULIN CANDIDATE, PT RH POSITIVE Performed at [facility] [address], [place], [place] [address]   hCG, quantitative, pregnancy     Status: Abnormal   Collection Time: 06/09/19  2:07 PM  Result Value Ref Range   hCG, Beta Chain, Quant, S 701 (H) <5 mIU/mL    Comment:  GEST. AGE      CONC.  (mIU/mL)   <=1 WEEK        5 - 50     2 WEEKS       50 - 500     3 WEEKS       100 - 10,000     4 WEEKS     1,000 - 30,000     5 WEEKS     3,500 - 115,000   6-8 WEEKS     12,000 - 270,000    12 WEEKS     15,000 - 220,000        FEMALE AND NON-PREGNANT FEMALE:     LESS  THAN 5 mIU/mL Performed at [facility], [place] [address], [place], [place] [address]    US OB LESS THAN 14 WEEKS WITH OB TRANSVAGINAL  Result Date: 06/09/2019 CLINICAL DATA:  Vaginal bleeding in first trimester of pregnancy, cramping increased since last night, LMP 04/30/2019 EXAM: OBSTETRIC <14 WK [place] AND TRANSVAGINAL OB US TECHNIQUE: Both transabdominal and transvaginal ultrasound examinations were performed for complete evaluation of the gestation as well as the maternal uterus, adnexal regions, and pelvic cul-de-sac. Transvaginal technique was performed to assess early pregnancy. COMPARISON:  None FINDINGS: Intrauterine gestational sac: None identified Yolk sac:  N/A Embryo:  N/A Cardiac Activity: N/A Heart Rate: N/A  bpm MSD:   mm    w     d CRL:    mm    w    d                  [place] EDC: Subchorionic hemorrhage:  N/A Maternal uterus/adnexae: Uterus anteverted, normal morphology. 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 Heterogeneous myometrium without additional mass. Endometrial complex unremarkable without fluid or gestational sac. RIGHT ovary normal size and morphology 3.7 x 1.6 x 1.6 cm. LEFT ovary normal size and morphology 2.8 x 1.0 x 2.0 cm. Trace free pelvic fluid. No adnexal masses. 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 No intrauterine gestation identified. Findings are compatible with pregnancy of unknown location. Differential diagnosis includes early intrauterine pregnancy too early to visualize, spontaneous abortion, and ectopic pregnancy. Serial quantitative beta hCG and or followup ultrasound recommended to definitively exclude ectopic pregnancy. Electronically Signed   By: Lavonia Dana M.D.   On: 06/09/2019 15:11   Review of Systems  Constitutional: Positive for fever.  Gastrointestinal: Positive for abdominal pain.  Genitourinary: Positive for vaginal bleeding. Negative for dysuria and vaginal discharge.   Physical Exam    Blood pressure 135/85, pulse (!) 112, temperature 98.1 F (36.7 C), temperature source Oral, resp. rate 16, height 5\' 6"  (1.676 m), weight 60.5 kg, last menstrual period 04/30/2019, SpO2 100 %.  Physical Exam  Constitutional: She is oriented to person, place, and time. She appears well-developed and well-nourished. No distress.  HENT:  Head: Normocephalic.  Respiratory: Effort normal.  GI: Soft. Normal appearance. There is abdominal tenderness in the periumbilical area and suprapubic area. There is no rigidity, no rebound and no guarding.  Genitourinary:    Pelvic exam was performed with patient supine.  Cervix exhibits no friability.    Vaginal bleeding present.  There is bleeding in the vagina.    Genitourinary Comments: Vagina - Small dark brown vaginal discharge, no odor  Cervix - + active bleeding  Bimanual exam: deferred  GC/Chlam, wet prep done Chaperone present for exam.    Musculoskeletal:  General: Normal range of motion.  Neurological: She is alert and oriented to person, place, and time.  Skin: Skin is warm. She is not diaphoretic.  Psychiatric: Her behavior is normal.   MAU Course  Procedures  None  MDM  B positive  Wet prep & GC HIV, CBC, Hcg, ABO US OB transvaginal   Assessment and Plan   A:  1. Pregnancy of unknown anatomic location   2. Vaginal bleeding in pregnancy, first trimester   3. Abdominal pain during pregnancy, antepartum   4. [redacted] weeks gestation of pregnancy     P:  Discharge home with strict ectopic precautions Return to MAU if symptoms worsen Return to MAU on Friday for repeat stat Quant Pelvic rest Patient considering termination.   Lezlie Lye, NP 06/09/2019 3:59 PM

## 2019-06-09 NOTE — MAU Note (Signed)
Pt came up from ER. +Preg test Sat, started bleeding that night. Cramping really bad, esp last night.

## 2019-06-09 NOTE — Discharge Instructions (Signed)
Vaginal Bleeding During Pregnancy, First Trimester  A small amount of bleeding from the vagina (spotting) is relatively common during early pregnancy. It usually stops on its own. Various things may cause bleeding or spotting during early pregnancy. Some bleeding may be related to the pregnancy, and some may not. In many cases, the bleeding is normal and is not a problem. However, bleeding can also be a sign of something serious. Be sure to tell your health care provider about any vaginal bleeding right away. Some possible causes of vaginal bleeding during the first trimester include:  Infection or inflammation of the cervix.  Growths (polyps) on the cervix.  Miscarriage or threatened miscarriage.  Pregnancy tissue developing outside of the uterus (ectopic pregnancy).  A mass of tissue developing in the uterus due to an egg being fertilized incorrectly (molar pregnancy). Follow these instructions at home: Activity  Follow instructions from your health care provider about limiting your activity. Ask what activities are safe for you.  If needed, make plans for someone to help with your regular activities.  Do not have sex or orgasms until your health care provider says that this is safe. General instructions  Take over-the-counter and prescription medicines only as told by your health care provider.  Pay attention to any changes in your symptoms.  Do not use tampons or douche.  Write down how many pads you use each day, how often you change pads, and how soaked (saturated) they are.  If you pass any tissue from your vagina, save the tissue so you can show it to your health care provider.  Keep all follow-up visits as told by your health care provider. This is important. Contact a health care provider if:  You have vaginal bleeding during any part of your pregnancy.  You have cramps or labor pains.  You have a fever. Get help right away if:  You have severe cramps in your  back or abdomen.  You pass large clots or a large amount of tissue from your vagina.  Your bleeding increases.  You feel light-headed or weak, or you faint.  You have chills.  You are leaking fluid or have a gush of fluid from your vagina. Summary  A small amount of bleeding (spotting) from the vagina is relatively common during early pregnancy.  Various things may cause bleeding or spotting in early pregnancy.  Be sure to tell your health care provider about any vaginal bleeding right away. This information is not intended to replace advice given to you by your health care provider. Make sure you discuss any questions you have with your health care provider. Document Released: 03/13/2005 Document Revised: 09/22/2018 Document Reviewed: 09/05/2016 Elsevier Patient Education  2020 [employer].

## 2019-06-10 LAB — GC/CHLAMYDIA PROBE AMP (~~LOC~~) NOT AT ARMC
Chlamydia: NEGATIVE
Comment: NEGATIVE
Comment: NORMAL
Neisseria Gonorrhea: NEGATIVE

## 2019-06-11 ENCOUNTER — Inpatient Hospital Stay (HOSPITAL_COMMUNITY)
Admission: AD | Admit: 2019-06-11 | Discharge: 2019-06-11 | Disposition: A | Discharge status: Home / Self Care | Payer: Self-pay | Attending: Family Medicine | Admitting: Family Medicine

## 2019-06-11 DIAGNOSIS — O3680X0 Pregnancy with inconclusive fetal viability, not applicable or unspecified: Secondary | ICD-10-CM | POA: Insufficient documentation

## 2019-06-11 DIAGNOSIS — O26891 Other specified pregnancy related conditions, first trimester: Secondary | ICD-10-CM | POA: Insufficient documentation

## 2019-06-11 DIAGNOSIS — Z6790 Unspecified blood type, Rh positive: Secondary | ICD-10-CM | POA: Insufficient documentation

## 2019-06-11 LAB — HCG, QUANTITATIVE, PREGNANCY: hCG, Beta Chain, Quant, S: 667 m[IU]/mL — ABNORMAL HIGH (ref <5)

## 2019-06-11 MED ORDER — METHOTREXATE FOR ECTOPIC PREGNANCY
50.0000 mg/m2 | Freq: Once | INTRAMUSCULAR | Status: AC
  Administered 2019-06-11: 20:00:00 85 mg via INTRAMUSCULAR
  Filled 2019-06-11: qty 1

## 2019-06-11 NOTE — Discharge Instructions (Signed)
Methotrexate Treatment for an Ectopic Pregnancy  Methotrexate is a medicine that treats an ectopic pregnancy. An ectopic pregnancy is a pregnancy in which the fetus develops outside the uterus. This kind of pregnancy can be dangerous. Methotrexate works by stopping the growth of the fertilized egg. It also helps your body absorb tissue from the egg. This takes between 2-6 weeks. Most ectopic pregnancies can be successfully treated with methotrexate if they are diagnosed early. Tell a health care provider about:  Any allergies you have.  All medicines you are taking, including vitamins, herbs, eye drops, creams, and over-the-counter medicines.  Any medical conditions you have. What are the risks? Generally, this is a safe treatment. However, problems may occur, including:  Nausea or vomiting or both.  Vaginal bleeding or spotting.  Diarrhea.  Abdominal cramping.  Dizziness or feeling lightheaded.  Mouth sores.  Swelling or irritation of the lining of your lungs (pneumonitis).  Liver damage.  Hair loss. There is a risk that methotrexate treatment will fail and your pregnancy will continue. There is also a risk that the ectopic pregnancy might rupture while you are using this medicine. What happens before the procedure?  Liver tests, kidney tests, and a complete blood test will be done.  Blood tests will be done to measure the pregnancy hormone levels and to determine your blood type.  If you are Rh-negative and the father is Rh-positive or his Rh type is not known, you will be given a Rho (D) immune globulin shot. What happens during the procedure? Your health care provider may give you methotrexate by injection or in the form of a pill. Methotrexate may be given as a single dose of medicine or a series of doses, depending on your response to the treatment.  Methotrexate injections will be given by your health care provider. This is the most common way that methotrexate is used  to treat an ectopic pregnancy.  If you are prescribed oral methotrexate, it is very important that you follow your health care provider's instructions on how to take oral methotrexate. Additional medicines may be needed to manage an ectopic pregnancy. The procedure may vary among health care providers and hospitals. What happens after the procedure?  You may have abdominal cramping, vaginal bleeding, and fatigue.  Blood tests will be taken at timed intervals for several days or weeks to check your pregnancy hormone levels. The blood tests will be done until the pregnancy hormone can no longer be detected in the blood.  You may need to have a surgical procedure to remove the ectopic pregnancy if methotrexate treatment fails.  Follow instructions from your health care provider on how and when to report any symptoms that may indicate a ruptured ectopic pregnancy. Summary  Methotrexate is a medicine that treats an ectopic pregnancy.  Methotrexate may be given in a single dose or a series of doses over time.  Blood tests will be taken at timed intervals for several days or weeks to check your pregnancy hormone levels. The blood tests will be done until no more pregnancy hormone is detected in the blood.  There is a risk that methotrexate treatment will fail and your pregnancy will continue. There is also a risk that the ectopic pregnancy might rupture while you are using this medicine. This information is not intended to replace advice given to you by your health care provider. Make sure you discuss any questions you have with your health care provider. Document Released: 05/28/2001 Document Revised: 05/16/2017 Document Reviewed:  07/23/2016 Elsevier Patient Education  [place]. Methotrexate Treatment for an Ectopic Pregnancy, Care After This sheet gives you information about how to care for yourself after your procedure. Your health care provider may also give you more specific  instructions. If you have problems or questions, contact your health care provider. What can I expect after the procedure? After the procedure, it is common to have:  Abdominal cramping.  Vaginal bleeding.  Fatigue.  Nausea.  Vomiting.  Diarrhea. Blood tests will be taken at timed intervals for several days or weeks to check your pregnancy hormone levels. The blood tests will be done until the pregnancy hormone can no longer be detected in the blood. Follow these instructions at home: Activity  Do not have sex until your health care provider approves.  Limit activities that take a lot of effort as told by your health care provider. Medicines  Take over the counter and prescription medicines only as told by your health care provider.  Do not take aspirin, ibuprofen, naproxen, or any other NSAIDs.  Do not take folic acid, prenatal vitamins, or other vitamins that contain folic acid. General instructions   Do not drink alcohol.  Follow instructions from your health care provider on how and when to report any symptoms that may indicate a ruptured ectopic pregnancy.  Keep all follow-up visits as told by your health care provider. This is important. Contact a health care provider if:  You have persistent nausea and vomiting.  You have persistent diarrhea.  You are having a reaction to the medicine, such as: ? Tiredness. ? Skin rash. ? Hair loss. Get help right away if:  Your abdominal or pelvic pain gets worse.  You have more vaginal bleeding.  You feel light-headed or you faint.  You have shortness of breath.  Your heart rate increases.  You develop a cough.  You have chills.  You have a fever. Summary  After the procedure, it is common to have symptoms of abdominal cramping, vaginal bleeding and fatigue. You may also experience other symptoms.  Blood tests will be taken at timed intervals for several days or weeks to check your pregnancy hormone levels.  The blood tests will be done until the pregnancy hormone can no longer be detected in the blood.  Limit strenuous activity as told by your health care provider.  Follow instructions from your health care provider on how and when to report any symptoms that may indicate a ruptured ectopic pregnancy. This information is not intended to replace advice given to you by your health care provider. Make sure you discuss any questions you have with your health care provider. Document Released: 05/23/2011 Document Revised: 05/16/2017 Document Reviewed: 07/23/2016 Elsevier Patient Education  [place]. Ectopic Pregnancy  An ectopic pregnancy is when the fertilized egg attaches (implants) outside the uterus. Most ectopic pregnancies occur in one of the tubes where eggs travel from the ovary to the uterus (fallopian tubes), but the implanting can occur in other locations. In rare cases, ectopic pregnancies occur on the ovary, intestine, pelvis, abdomen, or cervix. In an ectopic pregnancy, the fertilized egg does not have the ability to develop into a normal, healthy baby. A ruptured ectopic pregnancy is one in which tearing or bursting of a fallopian tube causes internal bleeding. Often, there is intense lower abdominal pain, and vaginal bleeding sometimes occurs. Having an ectopic pregnancy can be life-threatening. If this dangerous condition is not treated, it can lead to blood loss, shock, or  even death. What are the causes? The most common cause of this condition is damage to one of the fallopian tubes. A fallopian tube may be narrowed or blocked, and that keeps the fertilized egg from reaching the uterus. What increases the risk? This condition is more likely to develop in women of childbearing age who have different levels of risk. The levels of risk can be divided into three categories. High risk  You have gone through infertility treatment.  You have had an ectopic pregnancy before.  You  have had surgery on the fallopian tubes, or another surgical procedure, such as an abortion.  You have had surgery to have the fallopian tubes tied (tubal ligation).  You have problems or diseases of the fallopian tubes.  You have been exposed to diethylstilbestrol (DES). This medicine was used until 1971, and it had effects on babies whose mothers took the medicine.  You become pregnant while using an IUD (intrauterine device) for birth control. Moderate risk  You have a history of infertility.  You have had an STI (sexually transmitted infection).  You have a history of pelvic inflammatory disease (PID).  You have scarring from endometriosis.  You have multiple sexual partners.  You smoke. Low risk  You have had pelvic surgery.  You use vaginal douches.  You became sexually active before age 56. What are the signs or symptoms? Common symptoms of this condition include normal pregnancy symptoms, such as missing a period, nausea, tiredness, abdominal pain, breast tenderness, and bleeding. However, ectopic pregnancy will have additional symptoms, such as:  Pain with intercourse.  Irregular vaginal bleeding or spotting.  Cramping or pain on one side or in the lower abdomen.  Fast heartbeat, low blood pressure, and sweating.  Passing out while having a bowel movement. Symptoms of a ruptured ectopic pregnancy and internal bleeding may include:  Sudden, severe pain in the abdomen and pelvis.  Dizziness, weakness, light-headedness, or fainting.  Pain in the shoulder or neck area. How is this diagnosed? This condition is diagnosed by:  A pelvic exam to locate pain or a mass in the abdomen.  A pregnancy test. This blood test checks for the presence as well as the specific level of pregnancy hormone in the bloodstream.  Ultrasound. This is performed if a pregnancy test is positive. In this test, a probe is inserted into the vagina. The probe will detect a fetus, possibly  in a location other than the uterus.  Taking a sample of uterus tissue (dilation and curettage, or D&C).  Surgery to perform a visual exam of the inside of the abdomen using a thin, lighted tube that has a tiny camera on the end (laparoscope).  Culdocentesis. This procedure involves inserting a needle at the top of the vagina, behind the uterus. If blood is present in this area, it may indicate that a fallopian tube is torn. How is this treated? This condition is treated with medicine or surgery. Medicine  An injection of a medicine (methotrexate) may be given to cause the pregnancy tissue to be absorbed. This medicine may save your fallopian tube. It may be given if: ? The diagnosis is made early, with no signs of active bleeding. ? The fallopian tube has not ruptured. ? You are considered to be a good candidate for the medicine. Usually, pregnancy hormone blood levels are checked after methotrexate treatment. This is to be sure that the medicine is effective. It may take 4-6 weeks for the pregnancy to be absorbed. Most pregnancies  will be absorbed by 3 weeks. Surgery  A laparoscope may be used to remove the pregnancy tissue.  If severe internal bleeding occurs, a larger cut (incision) may be made in the lower abdomen (laparotomy) to remove the fetus and placenta. This is done to stop the bleeding.  Part or all of the fallopian tube may be removed (salpingectomy) along with the fetus and placenta. The fallopian tube may also be repaired during the surgery.  In very rare circumstances, removal of the uterus (hysterectomy) may be required.  After surgery, pregnancy hormone testing may be done to be sure that there is no pregnancy tissue left. Whether your treatment is medicine or surgery, you may receive a Rho (D) immune globulin shot to prevent problems with any future pregnancy. This shot may be given if:  You are Rh-negative and the baby's father is Rh-positive.  You are Rh-negative  and you do not know the Rh type of the baby's father. Follow these instructions at home:  Rest and limit your activity after the procedure for as long as told by your health care provider.  Until your health care provider says that it is safe: ? Do not lift anything that is heavier than 10 lb (4.5 kg), or the limit that your health care provider tells you. ? Avoid physical exercise and any movement that requires effort (is strenuous).  To help prevent constipation: ? Eat a healthy diet that includes fruits, vegetables, and whole grains. ? Drink 6-8 glasses of water per day. Get help right away if:  You develop worsening pain that is not relieved by medicine.  You have: ? A fever or chills. ? Vaginal bleeding. ? Redness and swelling at the incision site. ? Nausea and vomiting.  You feel dizzy or weak.  You feel light-headed or you faint. This information is not intended to replace advice given to you by your health care provider. Make sure you discuss any questions you have with your health care provider. Document Released: 07/11/2004 Document Revised: 05/16/2017 Document Reviewed: 01/03/2016 Elsevier Patient Education  [place].

## 2019-06-11 NOTE — MAU Note (Signed)
For follow up. Denies pain, small amt brown blood.

## 2019-06-11 NOTE — MAU Provider Note (Signed)
Subjective:  Diane Morton is a 28 y.o. G1P0000 at [redacted]w[redacted]d who presents today for FU BHCG. She was seen on 06/09/2019. Results from that day show no IUP on [place], RH positive and HCG 701. She reports vaginal bleeding and describes as a small mount of brown blood. She denies abdominal or pelvic pain.  Objective:  Physical Exam  Nursing note and vitals reviewed. Patient Vitals for the past 24 hrs:  BP Temp Temp src Pulse Resp SpO2  06/11/19 1830 132/89 98.2 F (36.8 C) Oral 91 16 100 %   Constitutional: She is oriented to person, place, and time. She appears well-developed and well-nourished. No distress.  HENT:  Head: Normocephalic.  Cardiovascular: Normal rate.  Respiratory: Effort normal.  GI: Soft. There is no tenderness.  Neurological: She is alert and oriented to person, place, and time. Skin: Skin is warm and dry.  Psychiatric: She has a normal mood and affect.   Results for orders placed or performed during the hospital encounter of 06/11/19 (from the past 24 hour(s))  Quant STAT     Status: Abnormal   Collection Time: 06/11/19  6:32 PM  Result Value Ref Range   hCG, Beta Chain, Quant, S 667 (H) <5 mIU/mL   Assessment/Plan: Pregnancy of unknown location HCG did not rise appropriately (701 --> 667) Discussed MTX vs. expectant management per consultation with Dr. Nehemiah Settle  The risks of methotrexate were reviewed including failure requiring repeat dosing or eventual surgery. She understands that methotrexate involves frequent return visits to monitor lab values and that she remains at risk of ectopic rupture until her beta is less than assay. ?The patient opts to proceed with methotrexate.  She has no history of hepatic or renal dysfunction, has normal BUN/Cr/LFT's/platelets.  She is felt to be reliable for follow-up. Side effects of photosensitivity & GI upset were discussed.  She knows to avoid direct sunlight and abstain from alcohol, NSAIDs and sexual intercourse for two weeks. She  was counseled to discontinue any MVI with folic acid. ?She understands to follow up on D4 (Monday 06/14/2019) and D7 (Thursday 06/17/2019) for repeat BHCG and was given the instruction sheet. ?Strict ectopic precautions were reviewed, the patient knows to call with any abdominal pain, vomiting, fainting, or any concerns with her health.  Day 0/1 Day 4 Day 7  Sunday Wednesday Saturday  Monday Thursday Sunday  Tuesday Friday Monday  Wednesday Saturday Tuesday  Thursday Sunday Wednesday  Friday Monday Thursday  Saturday Tuesday Friday    Methotrexate Treatment Protocol for Ectopic Pregnancy  [x] Pretreatment testing and instructions  [x] hCG concentration - 667 [x] Transvaginal ultrasound - pregnancy of unknown location 06/09/2019 [x] Blood group and Rh(D) typing - B Positive [x] Complete blood count  - WNL [x] Liver and renal function tests - WNL [x] Discontinue folic acid supplements [x] Counsel patient to avoid NSAIDs, recommend acetaminophen if an analgesic is needed  [x] Advise patient to refrain from sexual intercourse and strenuous exercise  Treatment day  Single dose protocol   1  hCG.  Administer Methotrexate 50 mg/m2 body surface area IM  4  hCG  7  hCG  If <15 percent hCG decline from day 4 to 7, give additional dose of methotrexate 50 mg/m2 IM  If ?15 percent hCG decline from day 4 to 7, draw hCG weekly until undetectable  14  hCG  If <15 percent hCG decline from day 7 to 14, give additional dose of methotrexate 50 mg/m2 IM  If ?15 percent hCG decline from day 7 to 14, check hCG  weekly until undetectable  21 and 28  If 3 doses have been given and there is a <15 percent hCG decline from day 21 to 28, proceed with laparoscopic surgery  Laparoscopy  If severe abdominal pain or an acute abdomen suggestive of tubal rupture occurs If ultrasonography reveals greater than 300 mL pelvic or other intraperitoneal fluid  The hCG concentration usually declines to less than 15 mIU/mL by 35  days postinjection but may take as long as 109 days. If the hCG does not decline to zero, a new pregnancy should be excluded; if the hCG is rising, a transvaginal ultrasound should be performed. Alternatively, some patients have a slow clearance of serum hCG. If three weekly values are similar, consider an additional dose of MTX (50 mg/m2) not to exceed the recommended maximum of three total doses. This typically accelerates the decline of serum hCG. The risk of gestational trophoblastic disease is low. Folinic acid rescue is not required for women treated with the single-dose protocol, even if multiple doses are ultimately given.   Prepared with data from:   [facility]. Clinical practice. Ectopic pregnancy. [place] Med 2009; 361:379  American College of Obstetricians and Gynecologists. ACOG Practice Bulletin No. 94: Medical management of ectopic pregnancy. Obstet Gynecol 2008; [id number].  -appts made at [facility] office for D4 (Monday 06/14/2019) and D7 (Thursday 06/17/2019) for repeat BHCG -strict ectopic/return MAU precautions given -pt discharged to home in stable condition  Viola Placeres, Gerrie Nordmann, NP  8:01 PM 06/11/2019

## 2019-06-11 NOTE — MAU Note (Signed)
Lab here for blood draw. Time to results explained.  Pt will return to lobby to wait.

## 2019-06-14 ENCOUNTER — Ambulatory Visit (INDEPENDENT_AMBULATORY_CARE_PROVIDER_SITE_OTHER): Payer: Self-pay

## 2019-06-14 ENCOUNTER — Other Ambulatory Visit: Payer: Self-pay

## 2019-06-14 DIAGNOSIS — O3680X0 Pregnancy with inconclusive fetal viability, not applicable or unspecified: Secondary | ICD-10-CM

## 2019-06-14 LAB — BETA HCG QUANT (REF LAB): hCG Quant: 705 m[IU]/mL

## 2019-06-14 NOTE — Progress Notes (Signed)
Patient seen and assessed by nursing staff.  Agree with documentation and plan.  

## 2019-06-14 NOTE — Progress Notes (Signed)
Pt here today for STAT beta lab s/p day 4 MTX.  Pt denies any vaginal bleeding but is having some mild cramping in lower abd.  Pt rates pain 6 on 0/10 pain scale.  Pt states that she is having issues going to the bathroom.  Pt reports last BM was two days ago and was her normal BM.  I advised pt that she can purchase Colace 100 mg po tablet and take twice a day to help soften her stools and see if that helps.  Pt explained that it will take approximately two hours for results and that I will call her.  Pt verbalized understanding.   Received results of STAT Beta of 705.  Dr. Kennon Rounds notified of results- provider recommendation to continue to f/u with day 7 STAT beta.   Notified pt provider's recommendation and to make sure that she continues to monitor for sx's such as bleeding like a period or her pain intensifies to go to MAU before her appt.  Pt verbalized understanding and that she will be here on 06/17/19 @  0900 for STAT Beta.    Mel Almond, RN 06/14/19

## 2019-06-17 ENCOUNTER — Inpatient Hospital Stay (HOSPITAL_COMMUNITY)
Admission: AD | Admit: 2019-06-17 | Discharge: 2019-06-17 | Disposition: A | Discharge status: Home / Self Care | Payer: Self-pay | Attending: Obstetrics & Gynecology | Admitting: Obstetrics & Gynecology

## 2019-06-17 ENCOUNTER — Other Ambulatory Visit: Payer: Self-pay

## 2019-06-17 ENCOUNTER — Ambulatory Visit (INDEPENDENT_AMBULATORY_CARE_PROVIDER_SITE_OTHER): Payer: Self-pay | Admitting: *Deleted

## 2019-06-17 ENCOUNTER — Encounter: Payer: Self-pay | Admitting: *Deleted

## 2019-06-17 VITALS — BP 115/75 | HR 83

## 2019-06-17 DIAGNOSIS — O0281 Inappropriate change in quantitative human chorionic gonadotropin (hCG) in early pregnancy: Secondary | ICD-10-CM

## 2019-06-17 DIAGNOSIS — O209 Hemorrhage in early pregnancy, unspecified: Secondary | ICD-10-CM | POA: Insufficient documentation

## 2019-06-17 DIAGNOSIS — Z3A01 Less than 8 weeks gestation of pregnancy: Secondary | ICD-10-CM | POA: Insufficient documentation

## 2019-06-17 DIAGNOSIS — O3680X0 Pregnancy with inconclusive fetal viability, not applicable or unspecified: Secondary | ICD-10-CM | POA: Insufficient documentation

## 2019-06-17 LAB — CBC WITH DIFFERENTIAL/PLATELET
Abs Immature Granulocytes: 0.03 10*3/uL (ref 0.00–0.07)
Basophils Absolute: 0 10*3/uL (ref 0.0–0.1)
Basophils Relative: 1 %
Eosinophils Absolute: 0.1 10*3/uL (ref 0.0–0.5)
Eosinophils Relative: 1 %
HCT: 38.8 % (ref 36.0–46.0)
Hemoglobin: 12.8 g/dL (ref 12.0–15.0)
Immature Granulocytes: 1 %
Lymphocytes Relative: 29 %
Lymphs Abs: 1.8 10*3/uL (ref 0.7–4.0)
MCH: 29.2 pg (ref 26.0–34.0)
MCHC: 33 g/dL (ref 30.0–36.0)
MCV: 88.6 fL (ref 80.0–100.0)
Monocytes Absolute: 0.5 10*3/uL (ref 0.1–1.0)
Monocytes Relative: 8 %
Neutro Abs: 3.8 10*3/uL (ref 1.7–7.7)
Neutrophils Relative %: 60 %
Platelets: 268 10*3/uL (ref 150–400)
RBC: 4.38 MIL/uL (ref 3.87–5.11)
RDW: 13.2 % (ref 11.5–15.5)
WBC: 6.3 10*3/uL (ref 4.0–10.5)
nRBC: 0 % (ref 0.0–0.2)

## 2019-06-17 LAB — COMPREHENSIVE METABOLIC PANEL
ALT: 27 U/L (ref 0–44)
AST: 25 U/L (ref 15–41)
Albumin: 4 g/dL (ref 3.5–5.0)
Alkaline Phosphatase: 46 U/L (ref 38–126)
Anion gap: 8 (ref 5–15)
BUN: 7 mg/dL (ref 6–20)
CO2: 23 mmol/L (ref 22–32)
Calcium: 9.5 mg/dL (ref 8.9–10.3)
Chloride: 106 mmol/L (ref 98–111)
Creatinine, Ser: 0.83 mg/dL (ref 0.44–1.00)
GFR calc Af Amer: >60 mL/min (ref >60)
GFR calc non Af Amer: >60 mL/min (ref >60)
Glucose, Bld: 91 mg/dL (ref 70–99)
Potassium: 3.8 mmol/L (ref 3.5–5.1)
Sodium: 137 mmol/L (ref 135–145)
Total Bilirubin: 0.5 mg/dL (ref 0.3–1.2)
Total Protein: 7.4 g/dL (ref 6.5–8.1)

## 2019-06-17 LAB — BETA HCG QUANT (REF LAB): hCG Quant: 706 m[IU]/mL

## 2019-06-17 MED ORDER — METHOTREXATE FOR ECTOPIC PREGNANCY
50.0000 mg/m2 | Freq: Once | INTRAMUSCULAR | Status: AC
  Administered 2019-06-17: 85 mg via INTRAMUSCULAR
  Filled 2019-06-17: qty 1

## 2019-06-17 NOTE — MAU Provider Note (Signed)
Subjective:  Diane Morton is a 28 y.o. G1P0000 at [redacted]w[redacted]d who presents today for FU BHCG. She was seen on 12/23 & 12/25. Results from 12/23 show no IUP. HCG levels showed abnormal rise [id number] She was offered expectant management vs MTX and she chose MTX. This was not a desired pregnancy.  She reports vaginal bleeding. She denies abdominal or pelvic pain.   Patient in the office today for Day 7 labs: HCG level did not decrease appropriately 705<706. The patient was called by the office and told to come into MAU for a 2nd dose of MTX. She is agreeable to the plan of care. She denies pain at this time.   Objective:  Physical Exam  Nursing note and vitals reviewed. Constitutional: She is oriented to person, place, and time. She appears well-developed and well-nourished. No distress.  HENT:  Head: Normocephalic.  Cardiovascular: Normal rate.  Respiratory: Effort normal.  GI: Soft. There is no tenderness.  Neurological: She is alert and oriented to person, place, and time. Skin: Skin is warm and dry.  Psychiatric: She has a normal mood and affect.   Results for orders placed or performed during the hospital encounter of 06/17/19 (from the past 24 hour(s))  Comprehensive metabolic panel     Status: None   Collection Time: 06/17/19  1:01 PM  Result Value Ref Range   Sodium 137 135 - 145 mmol/L   Potassium 3.8 3.5 - 5.1 mmol/L   Chloride 106 98 - 111 mmol/L   CO2 23 22 - 32 mmol/L   Glucose, Bld 91 70 - 99 mg/dL   BUN 7 6 - 20 mg/dL   Creatinine, Ser 0.83 0.44 - 1.00 mg/dL   Calcium 9.5 8.9 - 10.3 mg/dL   Total Protein 7.4 6.5 - 8.1 g/dL   Albumin 4.0 3.5 - 5.0 g/dL   AST 25 15 - 41 U/L   ALT 27 0 - 44 U/L   Alkaline Phosphatase 46 38 - 126 U/L   Total Bilirubin 0.5 0.3 - 1.2 mg/dL   GFR calc non Af Amer >60 >60 mL/min   GFR calc Af Amer >60 >60 mL/min   Anion gap 8 5 - 15  CBC with Differential/Platelet     Status: None   Collection Time: 06/17/19  1:01 PM  Result Value Ref Range   WBC 6.3 4.0 - 10.5 K/uL   RBC 4.38 3.87 - 5.11 MIL/uL   Hemoglobin 12.8 12.0 - 15.0 g/dL   HCT 38.8 36.0 - 46.0 %   MCV 88.6 80.0 - 100.0 fL   MCH 29.2 26.0 - 34.0 pg   MCHC 33.0 30.0 - 36.0 g/dL   RDW 13.2 11.5 - 15.5 %   Platelets 268 150 - 400 K/uL   nRBC 0.0 0.0 - 0.2 %   Neutrophils Relative % 60 %   Neutro Abs 3.8 1.7 - 7.7 K/uL   Lymphocytes Relative 29 %   Lymphs Abs 1.8 0.7 - 4.0 K/uL   Monocytes Relative 8 %   Monocytes Absolute 0.5 0.1 - 1.0 K/uL   Eosinophils Relative 1 %   Eosinophils Absolute 0.1 0.0 - 0.5 K/uL   Basophils Relative 1 %   Basophils Absolute 0.0 0.0 - 0.1 K/uL   Immature Granulocytes 1 %   Abs Immature Granulocytes 0.03 0.00 - 0.07 K/uL    Assessment/Plan: Pregnancy of unknown location: presumed ectopic.  HCG not declining 2nd dose of MTX given today in MAU. FU in 4 days in MAU for  Day 4 MTX labs.  No ibuprofen, stop prenatal vitamins Pelvic rest  Rasch, Artist Pais, NP 06/19/2019 1:11 PM

## 2019-06-17 NOTE — Progress Notes (Signed)
Pt here for Stat BHCG lab s/p [name] 7 MTX. Pt states she had one episode of small amount dk brown vaginal bleeding last evening. She states that her abdominal cramping which was present 3 days ago stopped the following [name]. Lab drawn and pt informed she will be called with results today within 2-3 hours. She voiced understanding.    1119  BHCG results received and discussed with Dr. Nehemiah Settle. He advised that pt needs repeat dose of MTX today due to inappropriate decrease in level of BHCG. I called pt and informed her of test results as well as plan of care. She voiced understanding and will go to MAU today. Report called to MAU staff.

## 2019-06-17 NOTE — MAU Note (Addendum)
Here for 2nd dose MTX.  Labs have been drawn. Pt denies pain.  States started bleeding this morning, after appt- red, like a period.  No GI problems after first shot.

## 2019-06-17 NOTE — Discharge Instructions (Signed)
Methotrexate Treatment for an Ectopic Pregnancy, Care After This sheet gives you information about how to care for yourself after your procedure. Your health care provider may also give you more specific instructions. If you have problems or questions, contact your health care provider. What can I expect after the procedure? After the procedure, it is common to have:  Abdominal cramping.  Vaginal bleeding.  Fatigue.  Nausea.  Vomiting.  Diarrhea. Blood tests will be taken at timed intervals for several days or weeks to check your pregnancy hormone levels. The blood tests will be done until the pregnancy hormone can no longer be detected in the blood. Follow these instructions at home: Activity  Do not have sex until your health care provider approves.  Limit activities that take a lot of effort as told by your health care provider. Medicines  Take over the counter and prescription medicines only as told by your health care provider.  Do not take aspirin, ibuprofen, naproxen, or any other NSAIDs.  Do not take folic acid, prenatal vitamins, or other vitamins that contain folic acid. General instructions   Do not drink alcohol.  Follow instructions from your health care provider on how and when to report any symptoms that may indicate a ruptured ectopic pregnancy.  Keep all follow-up visits as told by your health care provider. This is important. Contact a health care provider if:  You have persistent nausea and vomiting.  You have persistent diarrhea.  You are having a reaction to the medicine, such as: ? Tiredness. ? Skin rash. ? Hair loss. Get help right away if:  Your abdominal or pelvic pain gets worse.  You have more vaginal bleeding.  You feel light-headed or you faint.  You have shortness of breath.  Your heart rate increases.  You develop a cough.  You have chills.  You have a fever. Summary  After the procedure, it is common to have symptoms  of abdominal cramping, vaginal bleeding and fatigue. You may also experience other symptoms.  Blood tests will be taken at timed intervals for several days or weeks to check your pregnancy hormone levels. The blood tests will be done until the pregnancy hormone can no longer be detected in the blood.  Limit strenuous activity as told by your health care provider.  Follow instructions from your health care provider on how and when to report any symptoms that may indicate a ruptured ectopic pregnancy. This information is not intended to replace advice given to you by your health care provider. Make sure you discuss any questions you have with your health care provider. Document Revised: 05/16/2017 Document Reviewed: 07/23/2016 Elsevier Patient Education  [place]. Human Chorionic Gonadotropin Test Why am I having this test? A human chorionic gonadotropin (hCG) test is done to determine whether you are pregnant. It can also be used:  To diagnose an abnormal pregnancy.  To determine whether you have had a failed pregnancy (miscarriage) or are at risk of one. What is being tested? This test checks the level of the human chorionic gonadotropin (hCG) hormone in the blood. This hormone is produced during pregnancy by the cells that form the placenta. The placenta is the organ that grows inside your womb (uterus) to nourish a developing baby. When you are pregnant, hCG can be detected in your blood or urine 7 to 8 days before your missed period. It continues to go up for the first 8-10 weeks of pregnancy. The presence of hCG in your blood can  be measured with several different types of tests. You may have:  A urine test. ? Because this hormone is eliminated from your body by your kidneys, you may have a urine test to find out whether you are pregnant. A home pregnancy test detects whether there is hCG in your urine. ? A urine test only shows whether there is hCG in your urine. It does not  measure how much.  A qualitative blood test. ? You may have this type of blood test to find out if you are pregnant. ? This blood test only shows whether there is hCG in your blood. It does not measure how much.  A quantitative blood test. ? This type of blood test measures the amount of hCG in your blood. ? You may have this test to:  Diagnose an abnormal pregnancy.  Check whether you have had a miscarriage.  Determine whether you are at risk of a miscarriage. What kind of sample is taken?     Two kinds of samples may be collected to test for the hCG hormone.  Blood. It is usually collected by inserting a needle into a blood vessel.  Urine. It is usually collected by urinating into a germ-free (sterile) specimen cup. It is best to collect the sample the first time you urinate in the morning. How do I prepare for this test? No preparation is needed for a blood test.  For the urine test:  Let your health care provider know about: ? All medicines you are taking, including vitamins, herbs, creams, and over-the-counter medicines. ? Any blood in your urine. This may interfere with the result.  Do not drink too much fluid. Drink as you normally would, or as directed by your health care provider. How are the results reported? Depending on the type of test that you have, your test results may be reported as values. Your health care provider will compare your results to normal ranges that were established after testing a large group of people (reference ranges). Reference ranges may vary among labs and hospitals. For this test, common reference ranges that show absence of pregnancy are:  Quantitative hCG blood levels: less than 5 IU/L. Other results will be reported as either positive or negative. For this test, normal results (meaning the absence of pregnancy) are:  Negative for hCG in the urine test.  Negative for hCG in the qualitative blood test. What do the results mean? Urine  and qualitative blood test  A negative result could mean: ? That you are not pregnant. ? That the test was done too early in your pregnancy to detect hCG in your blood or urine. If you still have other signs of pregnancy, the test will be repeated.  A positive result means: ? That you are most likely pregnant. Your health care provider may confirm your pregnancy with an imaging study (ultrasound) of your uterus, if needed. Quantitative blood test Results of the quantitative hCG blood test will be interpreted as follows:  Less than 5 IU/L: You are most likely not pregnant.  Greater than 25 IU/L: You are most likely pregnant.  hCG levels that are higher than expected: ? You are pregnant with twins. ? You have abnormal growths in the uterus.  hCG levels that are rising more slowly than expected: ? You have an ectopic pregnancy (also called a tubal pregnancy).  hCG levels that are falling: ? You may be having a miscarriage. Talk with your health care provider about what your results mean.  Questions to ask your health care provider Ask your health care provider, or the department that is doing the test:  When will my results be ready?  How will I get my results?  What are my treatment options?  What other tests do I need?  What are my next steps? Summary  A human chorionic gonadotropin test is done to determine whether you are pregnant.  When you are pregnant, hCG can be detected in your blood or urine 7 to 8 days before your missed period. It continues to go up for the first 8-10 weeks of pregnancy.  Your hCG level can be measured with different types of tests. You may have a urine test, a qualitative blood test, or a quantitative blood test.  Talk with your health care provider about what your results mean. This information is not intended to replace advice given to you by your health care provider. Make sure you discuss any questions you have with your health care  provider. Document Revised: 05/05/2017 Document Reviewed: 05/05/2017 Elsevier Patient Education  2020 [employer].

## 2019-06-18 NOTE — Progress Notes (Signed)
Chart reviewed - agree with RN documentation.   

## 2019-06-20 ENCOUNTER — Other Ambulatory Visit: Payer: Self-pay

## 2019-06-20 ENCOUNTER — Encounter (HOSPITAL_COMMUNITY): Payer: Self-pay | Admitting: Obstetrics and Gynecology

## 2019-06-20 ENCOUNTER — Inpatient Hospital Stay (HOSPITAL_COMMUNITY)
Admission: AD | Admit: 2019-06-20 | Discharge: 2019-06-20 | Disposition: A | Discharge status: Home / Self Care | Payer: Self-pay | Attending: Obstetrics and Gynecology | Admitting: Obstetrics and Gynecology

## 2019-06-20 DIAGNOSIS — O009 Unspecified ectopic pregnancy without intrauterine pregnancy: Secondary | ICD-10-CM | POA: Insufficient documentation

## 2019-06-20 DIAGNOSIS — O26851 Spotting complicating pregnancy, first trimester: Secondary | ICD-10-CM | POA: Insufficient documentation

## 2019-06-20 DIAGNOSIS — R109 Unspecified abdominal pain: Secondary | ICD-10-CM | POA: Insufficient documentation

## 2019-06-20 DIAGNOSIS — Z3A01 Less than 8 weeks gestation of pregnancy: Secondary | ICD-10-CM | POA: Insufficient documentation

## 2019-06-20 DIAGNOSIS — O26891 Other specified pregnancy related conditions, first trimester: Secondary | ICD-10-CM | POA: Insufficient documentation

## 2019-06-20 LAB — HCG, QUANTITATIVE, PREGNANCY: hCG, Beta Chain, Quant, S: 674 m[IU]/mL — ABNORMAL HIGH (ref <5)

## 2019-06-20 NOTE — Discharge Instructions (Signed)
F/U at [place] for [place] on 06/24/19 for repeat bloodwork.     Ref. Range 06/17/2019 09:22 06/17/2019 13:01 06/20/2019 19:03  hCG Quant Latest Units: mIU/mL 706    HCG, Beta Chain, Quant, S Latest Ref Range: <5 mIU/mL   674 (H)

## 2019-06-20 NOTE — MAU Provider Note (Signed)
History    First Provider Initiated Sport and exercise psychologist with Patient 06/21/2019 at 2005.    Chief Complaint:  Follow-up   Diane Morton is  29 y.o. G1P0000 Patient's last menstrual period was 04/30/2019.Marland Kitchen Patient is here for follow up of quantitative HCG and ongoing surveillance of pregnancy status.   She is day #4 S/P MTX injection #2 for ectopic pregnancy. No mass visualized on [place].   Since her last visit, the patient is with new complaint.  States she passed some tissue a few days ago and brought it with her as the suggestion of a previous provider.    ROS Abdominal Pain: Mild cramping Vaginal bleeding: brown and spotting.   Passage of clots or tissue: Yes Dizziness: Denies  B POS  Her previous Quantitative HCG values are: Results for Diane Morton (MRN [id number]) as of 06/21/2019 10:57  Ref. Range 06/09/2019 14:07 06/11/2019 18:32 06/14/2019 09:28 06/17/2019 09:22  hCG Quant Latest Units: mIU/mL   705 706  HCG, Beta Chain, Quant, S Latest Ref Range: <5 mIU/mL 701 (H) 667 (H)      Physical Exam   Patient Vitals for the past 24 hrs:  BP Temp Temp src Pulse Resp SpO2  06/20/19 1817 111/67 98.2 F (36.8 C) Oral 77 16 100 %   Constitutional: Well-nourished female in no apparent distress. No pallor Neuro: Alert and oriented 4 Cardiovascular: Normal rate Respiratory: Normal effort and rate Abdomen: Soft, nontender Gynecological Exam: examination not indicated  Labs: No results found for this or any previous visit (from the past 24 hour(s)).  Results for Diane Morton (MRN [id number]) as of 06/21/2019 10:57  Ref. Range 06/20/2019 19:03  HCG, Beta Chain, Quant, S Latest Ref Range: <5 mIU/mL 674 (H)    Ultrasound Studies:   NA  MAU course/MDM: Quantitative hCG ordered  Day #4 S/P MTX #2 w/ slight drop in quant and w/out Sx of rupture.  Passage of tissue. Sent to pathology.   Assessment: PUL Tx'd w/ MTX. No evidence of ruptured ectopic.   Plan: Discharge home in stable  condition. Ectopic precautions Support given [place] for [facility] Follow up on 06/24/2019.   Specialty: Obstetrics and Gynecology Why: at 2:00 PM for repeat bloodwork Contact information: [address] [place], [place] [id number] [place] [phone] [facility] Follow up.   Specialty: Obstetrics and Gynecology Why: as needed in pregnancy emergencies Contact information: [address] [id number] [place] [phone]          Allergies as of 06/20/2019      Reactions   Latex Itching   & burning      Medication List    TAKE these medications   nystatin-triamcinolone cream Commonly known as: MYCOLOG II Apply 1 application topically 2 (two) times daily. Apply to affected area BID for up to 7 days.       Tamala Julian, [place], [place] 06/20/2019, 6:41 PM  2/3

## 2019-06-20 NOTE — MAU Note (Signed)
Zade Turnier is a 29 y.o. at [redacted]w[redacted]d here in MAU reporting: here for day 4 labs post 2nd dose of MTX. Having some cramping and light bleeding.   Pain score: 3/10  Vitals:   06/20/19 1817  BP: 111/67  Pulse: 77  Resp: 16  Temp: 98.2 F (36.8 C)  SpO2: 100%     Lab orders placed from triage: hcg

## 2019-06-22 LAB — SURGICAL PATHOLOGY

## 2019-06-24 ENCOUNTER — Other Ambulatory Visit: Payer: Self-pay

## 2019-06-24 ENCOUNTER — Ambulatory Visit (INDEPENDENT_AMBULATORY_CARE_PROVIDER_SITE_OTHER): Payer: Self-pay | Admitting: General Practice

## 2019-06-24 DIAGNOSIS — O00109 Unspecified tubal pregnancy without intrauterine pregnancy: Secondary | ICD-10-CM

## 2019-06-24 LAB — BETA HCG QUANT (REF LAB): hCG Quant: 297 m[IU]/mL

## 2019-06-24 NOTE — Progress Notes (Signed)
Patient presents to office today for stat bhcg day# 7 labs following second dose of MTX on 12/31. Patient reports increased cramps for the past several days in her lower abdomen and back- rates pain at a 7. She also reports dark brown light bleeding. Discussed with patient we are monitoring your bhcg levels today, results/history will be reviewed with a provider in office & we will call you in approximately 2 hours with results/updated plan of care. Patient verbalized understanding & provided call back number [phone].  Reviewed results with Dr Rip Harbour who finds appropriate decrease in bhcg levels, patient should follow up in 1 week for repeat bhcg. Will call patient with results.  Called patient & informed her of bhcg results. Discussed recommended follow up in 1 week & reviewed ectopic precautions. Patient verbalized understanding & states she will come 1/14 @ 130 for repeat bhcg.   Koren Bound RN BSN 06/24/19

## 2019-06-24 NOTE — Progress Notes (Signed)
Agree with A & P. 

## 2019-07-01 ENCOUNTER — Other Ambulatory Visit: Payer: Self-pay

## 2019-07-01 DIAGNOSIS — O00109 Unspecified tubal pregnancy without intrauterine pregnancy: Secondary | ICD-10-CM

## 2019-07-01 NOTE — Progress Notes (Signed)
Patient here today for non stat beta hcg following tubal pregnancy. Pt would like to know plan of care following today's lab visit. I explained to pt that based on results from today's lab draw her provider will decide ongoing plan of care. Explained that she will be contacted either with a follow-up appt or further instructions. Pt agreeable to plan.

## 2019-07-02 LAB — BETA HCG QUANT (REF LAB): hCG Quant: 16 m[IU]/mL

## 2019-07-02 NOTE — Progress Notes (Signed)
Message to front office to call and schedule pt for non stat beta in 1 week.

## 2019-07-09 ENCOUNTER — Other Ambulatory Visit: Payer: Self-pay

## 2019-07-09 DIAGNOSIS — O00109 Unspecified tubal pregnancy without intrauterine pregnancy: Secondary | ICD-10-CM

## 2019-07-10 LAB — BETA HCG QUANT (REF LAB): hCG Quant: 1 m[IU]/mL

## 2019-07-12 ENCOUNTER — Telehealth: Payer: Self-pay

## 2019-07-12 NOTE — Telephone Encounter (Addendum)
-----   Message from Woodroe Mode, MD sent at 07/12/2019 10:22 AM EST ----- Negative result, may keep appt as scheduled  Notified pt that her beta results are a 1 and that the provider has recommended that she follows up at her appt that she has already scheduled on 07/22/19.  Pt verbalized understanding.   Mel Almond, RN 07/12/19

## 2019-07-22 ENCOUNTER — Ambulatory Visit (INDEPENDENT_AMBULATORY_CARE_PROVIDER_SITE_OTHER): Payer: Self-pay | Admitting: Obstetrics and Gynecology

## 2019-07-22 ENCOUNTER — Other Ambulatory Visit: Payer: Self-pay

## 2019-07-22 ENCOUNTER — Encounter: Payer: Self-pay | Admitting: Obstetrics and Gynecology

## 2019-07-22 VITALS — BP 127/91 | HR 90 | Wt 133.3 lb

## 2019-07-22 DIAGNOSIS — Z3009 Encounter for other general counseling and advice on contraception: Secondary | ICD-10-CM

## 2019-07-22 DIAGNOSIS — Z3042 Encounter for surveillance of injectable contraceptive: Secondary | ICD-10-CM

## 2019-07-22 MED ORDER — MEDROXYPROGESTERONE ACETATE 150 MG/ML IM SUSP
150.0000 mg | Freq: Once | INTRAMUSCULAR | Status: AC
  Administered 2019-07-22: 150 mg via INTRAMUSCULAR

## 2019-07-22 NOTE — Progress Notes (Signed)
S:  [name] is a 29 y.o. female here in the office to discuss contraception.  She desires DEPO, has used it in the past and like it.  She is considering a LARC and would like to schedule to have this done with her annual.    O:  GENERAL: Well-developed, well-nourished female in no acute distress.  LUNGS: Effort normal SKIN: Warm, dry and without erythema PSYCH: Normal mood and affect  A:  1. Birth control counseling 2. Depo-Provera contraceptive status  Return for annual exam with pap   Diane Morton, Artist Pais, NP 07/24/2019 10:52 AM

## 2019-07-23 LAB — POCT PREGNANCY, URINE: Preg Test, Ur: NEGATIVE

## 2019-07-24 DIAGNOSIS — Z3009 Encounter for other general counseling and advice on contraception: Secondary | ICD-10-CM | POA: Insufficient documentation

## 2019-07-24 DIAGNOSIS — Z3042 Encounter for surveillance of injectable contraceptive: Secondary | ICD-10-CM | POA: Insufficient documentation

## 2019-10-07 ENCOUNTER — Encounter: Payer: Self-pay | Admitting: Obstetrics and Gynecology

## 2019-10-07 ENCOUNTER — Other Ambulatory Visit: Payer: Self-pay

## 2019-10-07 ENCOUNTER — Other Ambulatory Visit (HOSPITAL_COMMUNITY)
Admission: RE | Admit: 2019-10-07 | Discharge: 2019-10-07 | Disposition: A | Discharge status: Home / Self Care | Payer: Medicaid Other | Source: Ambulatory Visit | Attending: General Surgery | Admitting: General Surgery

## 2019-10-07 ENCOUNTER — Ambulatory Visit (INDEPENDENT_AMBULATORY_CARE_PROVIDER_SITE_OTHER): Payer: Medicaid Other | Admitting: Obstetrics and Gynecology

## 2019-10-07 VITALS — BP 129/77 | HR 104 | Wt 138.0 lb

## 2019-10-07 DIAGNOSIS — Z3042 Encounter for surveillance of injectable contraceptive: Secondary | ICD-10-CM | POA: Diagnosis not present

## 2019-10-07 DIAGNOSIS — Z01419 Encounter for gynecological examination (general) (routine) without abnormal findings: Secondary | ICD-10-CM | POA: Diagnosis present

## 2019-10-07 DIAGNOSIS — Z1272 Encounter for screening for malignant neoplasm of vagina: Secondary | ICD-10-CM

## 2019-10-07 MED ORDER — MEDROXYPROGESTERONE ACETATE 150 MG/ML IM SUSP
150.0000 mg | Freq: Once | INTRAMUSCULAR | Status: AC
  Administered 2019-10-07: 150 mg via INTRAMUSCULAR

## 2019-10-07 NOTE — Progress Notes (Signed)
GYNECOLOGY ANNUAL PREVENTATIVE CARE ENCOUNTER NOTE  History:     Diane Morton is a 29 y.o. G77P0000 female here for a routine annual gynecologic exam.  Current complaints: None.   Denies abnormal vaginal bleeding, discharge, pelvic pain, problems with intercourse or other  gynecologic concerns.   Sexual orientation: Straight- sex with Men.  Declines STI testing.   Gynecologic History No LMP recorded. Contraception: Depo-Provera injections Last Pap: 2017 in [place]. Results were: normal with negative HPV Last mammogram: NA  Obstetric History OB History  Gravida Para Term Preterm AB Living  1 0 0 0 0 0  SAB TAB Ectopic Multiple Live Births  0 0 0 0      # Outcome Date GA Lbr Len/2nd Weight Sex Delivery Anes PTL Lv  1 Gravida             Past Medical History:  Diagnosis Date  . Menorrhagia     Past Surgical History:  Procedure Laterality Date  . APPENDECTOMY      Current Outpatient Medications on File Prior to Visit  Medication Sig Dispense Refill  . nystatin-triamcinolone (MYCOLOG II) cream Apply 1 application topically 2 (two) times daily. Apply to affected area BID for up to 7 days. (Patient not taking: Reported on 09/15/2014) 30 g 0   No current facility-administered medications on file prior to visit.    Allergies  Allergen Reactions  . Latex Itching    & burning    Social History:  reports that she has never smoked. She has never used smokeless tobacco. She reports that she does not drink alcohol or use drugs.  Family History  Problem Relation Age of Onset  . Asthma Mother     The following portions of the patient's history were reviewed and updated as appropriate: allergies, current medications, past family history, past medical history, past social history, past surgical history and problem list.  Review of Systems Pertinent items noted in HPI and remainder of comprehensive ROS otherwise negative.  Physical Exam:  BP 129/77   Pulse (!) 104   Wt  138 lb (62.6 kg)   Breastfeeding Unknown   BMI 22.27 kg/m  CONSTITUTIONAL: Well-developed, well-nourished female in no acute distress.  HENT:  Normocephalic, atraumatic, External right and left ear normal. Oropharynx is clear and moist EYES: Conjunctivae and EOM are normal. Pupils are equal, round, and reactive to light. No scleral icterus.  NECK: Normal range of motion, supple, no masses.  Normal thyroid.  SKIN: Skin is warm and dry. No rash noted. Not diaphoretic. No erythema. No pallor. MUSCULOSKELETAL: Normal range of motion. No tenderness.  No cyanosis, clubbing, or edema.  2+ distal pulses. NEUROLOGIC: Alert and oriented to person, place, and time. Normal reflexes, muscle tone coordination.  PSYCHIATRIC: Normal mood and affect. Normal behavior. Normal judgment and thought content. CARDIOVASCULAR: Normal heart rate noted, regular rhythm RESPIRATORY: Clear to auscultation bilaterally. Effort and breath sounds normal, no problems with respiration noted. BREASTS: Symmetric in size. No masses, tenderness, skin changes, nipple drainage, or lymphadenopathy bilaterally. Performed in the presence of a chaperone. ABDOMEN: Soft, no distention noted.  No tenderness, rebound or guarding.  PELVIC: Normal appearing external genitalia and urethral meatus; normal appearing vaginal mucosa and cervix.  No abnormal discharge noted.  Pap smear obtained.  Normal uterine size, no other palpable masses, no uterine or adnexal tenderness.  Performed in the presence of a chaperone.   Assessment and Plan:  1. Women's annual routine gynecological examination  - Depo today.  -  Cytology - PAP( [place])  Will follow up results of pap smear and manage accordingly. Routine preventative health maintenance measures emphasized. Please refer to After Visit Summary for other counseling recommendations.     [name], Artist Pais, [place] for [employer], [place]

## 2019-10-08 LAB — TSH: TSH: 1.62 u[IU]/mL (ref 0.450–4.500)

## 2019-10-08 LAB — HIV ANTIBODY (ROUTINE TESTING W REFLEX): HIV Screen 4th Generation wRfx: NONREACTIVE

## 2019-10-11 LAB — CYTOLOGY - PAP
Chlamydia: NEGATIVE
Comment: NEGATIVE
Comment: NEGATIVE
Comment: NORMAL
Diagnosis: NEGATIVE
High risk HPV: NEGATIVE
Neisseria Gonorrhea: NEGATIVE

## 2019-10-14 ENCOUNTER — Ambulatory Visit: Payer: Medicaid Other | Attending: Internal Medicine

## 2019-10-14 DIAGNOSIS — Z23 Encounter for immunization: Secondary | ICD-10-CM

## 2019-10-14 NOTE — Progress Notes (Signed)
   Covid-19 Vaccination Clinic  Name:  Diane Morton    MRN: [id number] DOB: August 05, 1990  10/14/2019  Diane Morton was observed post Covid-19 immunization for 15 minutes without incident. She was provided with Vaccine Information Sheet and instruction to access the V-Safe system.   Diane Morton was instructed to call 911 with any severe reactions post vaccine: Marland Kitchen Difficulty breathing  . Swelling of face and throat  . A fast heartbeat  . A bad rash all over body  . Dizziness and weakness   Immunizations Administered    Name Date Dose VIS Date Route   Pfizer COVID-19 Vaccine 10/14/2019  3:45 PM 0.3 mL 08/11/2018 Intramuscular   Manufacturer: [place]   Lot: [id number]   [place]: [id number]

## 2019-12-23 ENCOUNTER — Other Ambulatory Visit: Payer: Self-pay

## 2019-12-23 ENCOUNTER — Ambulatory Visit (INDEPENDENT_AMBULATORY_CARE_PROVIDER_SITE_OTHER): Payer: Medicaid Other | Admitting: *Deleted

## 2019-12-23 DIAGNOSIS — Z3042 Encounter for surveillance of injectable contraceptive: Secondary | ICD-10-CM | POA: Diagnosis not present

## 2019-12-23 MED ORDER — MEDROXYPROGESTERONE ACETATE 150 MG/ML IM SUSP
150.0000 mg | Freq: Once | INTRAMUSCULAR | Status: AC
  Administered 2019-12-23: 150 mg via INTRAMUSCULAR

## 2019-12-23 NOTE — Progress Notes (Signed)
Here for depo-provera today. Also noted does report some food insecurity. Food resources given in AVS. Tolerated injection without complaint. Advised to make next appointment at check out. She voices understanding. Andrell Tallman,RN

## 2019-12-23 NOTE — Progress Notes (Signed)
Patient seen and assessed by nursing staff.  Agree with documentation and plan.  

## 2019-12-23 NOTE — Patient Instructions (Signed)
[place] [address] [place], [place] [address] [phone]   or  www.http://james-garner.info/ **SNAP/EBT/ Other nutritional benefits  [facility] [address], [place], [place] [address] [phone]  or  https://palmer-smith.com/ **WIC for  women who are pregnant and postpartum, infants and children up to 29 years old  [place] [address] [place], [place] [address] [phone]   or   www.theblessedtable.org  **Food pantry  [employer] [facility] [address], [place], [place] [address] [phone]   or   https://brotherkolbes.godaddysites.com  **Emergency food and prepared meals  [place] [address] [place], [place] [address] [phone]   or   www.cedargrovetop.us **Food pantry  [place] [facility] [address], [place], [place] [address] [phone]   or   www.https://hartman-jones.net/ **Food pantry  [employer] [facility] [address] [place], [place] [address] [phone] **Food pantry  [facility] [address], [place], [place] [address] [phone]   or   www.greensborourbanministry.org  Insurance underwriter and prepared meals  [facility] [address] [place], [place], [place], [place] [address] DomainerFinder.be  **Food pantry  [place] [employer] [facility] [address], [place], [place] [address] [phone]   or   www.lbcnow.org  **Food pantry  [facility] [address], [place], [place] [address] [phone]   or   http://patterson-parker.net/ **Food pantry, nutrition education, gardening activities  [place] [address], [place], [place] [address] [phone] **Food pantry  [facility] [place] [address], [place], [place] [address] [phone]   or   www.salvationarmyofgreensboro.Lovette Cliche of [place], [place], [place] [address] [phone]   or   http://senior-resources-guilford.org [facility] [place] [address], [place], [place] [address] [phone]   or   www.stmattchurch.com  **Food pantry  [place] [address], [place], [place] [address] [phone]   or   vandaliapresbyterianchurch.org **Food pantry  [place] [facility] [address] [place], [place] , [place] [address] [phone]   or   www.Mormon101.be Food Civil Service fast streamer of [place] [address] [place]into [place], [place] [address] [phone] **Food pantry  [place] [facility]   Department of [facility] [address] [place], [place] [address] [phone]   or   www.co.[place].us/ph/  [facility]) [address], [place], [place] [address] [phone]   or   MedicationWebsites.com.au **WIC for pregnant and postpartum women, infants and children up to 66 years old  [facility] [address], [place], [place] [address] [phone] **Food pantry  [place] [address] [place], [place] [address] [phone]   or   emerywoodbaptistchurch.com *Food pantry  [employer] [facility] [address] [place], [place] [address] [phone]   or   www.fcchighpoint.Radonna RickerFood pantry  [facility] [address], [place], [place] [address] [phone]   or    http://www.green.com/ **Food pantry  [place] [address], [place], [place] [address] [phone]   or   www.facebook.com/KBCI1 Equities trader Pantry [place] [employer]  [address] [place], [place] [address] [phone]   or   www.abbottscreek.org [employer] of [place] [phone]   **Delivers meals  [facility]nings Full [facility] [address] [place], [place] [address] [phone]   or   nbfgm.sundaystreamwebsites.com  Programme researcher, broadcasting/film/video of [employer] [address] [place], [place] [address] [phone]   or   www.odm-hp.org  **Food pantry  [place] [address], [place], [place] [address] [phone]   or   R2live.tv **Food pantry  [place] [address], [place], [place] [address] [phone]   or   ClubMonetize.fr **Emergency food and pet food  [facility] [place] [address], [place], [place] [address] [phone]   or   www.senioradults.org **Congregate and delivered meals to older adults  [place] [employer] [address], [place], [place] [address] [phone]   or   SamedayNews.com.cy **Back Pack Program for elementary school students  [place] [address] [place], [place] [address] [phone]   or   www.wardstreetcommunityresources.org **Food pantry  [facility] [address] [place], [place] [address] [phone]   or   https://www.carlson.net/ **Emergency food, nutrition classes, food budgeting  Food Resources [place]  [address] [place] [address] [place], [place], [place] [address] [phone]  or   www.co.[place].us/pview.aspx?id=14850&catid=407 **SNAP/Other nutrition benefits  [place] [address], [place], [place] [address] [phone]   or  http://goodwin-walker.biz/  **SNAP/Other nutrition benefits  [place], [place], [place] [address] [phone]  or  http://www.rockinghamcountypublichealth.org **WIC for pregnant and postpartum women, infants and children up to 36 years old  Aging, Disability and [place] [address] [place], [place] [address] [phone]  or www.risodejaneiro.com **Prepared meals for older adults  [place] [address], [place], [place] [address] [phone]  or  NetworkingMixer.com.ee **Food pantry  [facility] [address], [place], [place] [address] [phone]   or   https://www.handsofgod.org/  Insurance underwriter  [facility] in [place] [address], [place], [place] [address] [phone] **Food pantry  [facility] for [place], [place], [place] [address] [phone]   or   www.ci.[place].us/government/parks_and_recreation/senior_center/index.php **Congregate meal for older adults  [facility] [address], [place], [place] [address] [phone]   or www.reidsvilleoutreachcenter.org  **Food pantry  [facility] [address] [place],  [address] [phone]   or   http://reid-chang.com/ **Food pantry

## 2020-03-09 ENCOUNTER — Ambulatory Visit (INDEPENDENT_AMBULATORY_CARE_PROVIDER_SITE_OTHER): Payer: Medicaid Other | Admitting: General Practice

## 2020-03-09 ENCOUNTER — Other Ambulatory Visit: Payer: Self-pay

## 2020-03-09 VITALS — BP 130/84 | HR 88 | Ht 66.0 in | Wt 143.0 lb

## 2020-03-09 DIAGNOSIS — Z3042 Encounter for surveillance of injectable contraceptive: Secondary | ICD-10-CM

## 2020-03-09 MED ORDER — MEDROXYPROGESTERONE ACETATE 150 MG/ML IM SUSP
150.0000 mg | Freq: Once | INTRAMUSCULAR | Status: AC
  Administered 2020-03-09: 150 mg via INTRAMUSCULAR

## 2020-03-09 NOTE — Progress Notes (Signed)
Patient was assessed and managed by nursing staff during this encounter. I have reviewed the chart and agree with the documentation and plan. I have also made any necessary editorial changes.  Griffin Basil, MD 03/09/2020 12:04 PM

## 2020-03-09 NOTE — Progress Notes (Signed)
Elie Goody here for Depo-Provera  Injection.  Injection administered without complication. Patient will return in 3 months for next injection.  Derinda Late, RN 03/09/2020  10:13 AM

## 2020-03-24 ENCOUNTER — Other Ambulatory Visit: Payer: Medicaid Other

## 2020-03-24 DIAGNOSIS — Z20822 Contact with and (suspected) exposure to covid-19: Secondary | ICD-10-CM

## 2020-03-27 LAB — NOVEL CORONAVIRUS, NAA: SARS-CoV-2, NAA: NOT DETECTED

## 2020-04-14 ENCOUNTER — Other Ambulatory Visit: Payer: Medicaid Other

## 2020-05-19 ENCOUNTER — Telehealth: Payer: Self-pay | Admitting: *Deleted

## 2020-05-19 NOTE — Telephone Encounter (Signed)
Diane Morton left a message this pm that she had a birth control shot appointment she wants to change to am appointment if possible- would like 9a or 920 same day if possible. Would like a call back or appointment changed and she will check [place].  Will route to registar. Syesha Thaw,RN

## 2020-05-25 ENCOUNTER — Ambulatory Visit: Payer: Medicaid Other

## 2020-05-29 ENCOUNTER — Ambulatory Visit: Payer: Medicaid Other

## 2020-05-31 ENCOUNTER — Ambulatory Visit (INDEPENDENT_AMBULATORY_CARE_PROVIDER_SITE_OTHER): Payer: Medicaid Other | Admitting: Student

## 2020-05-31 ENCOUNTER — Encounter: Payer: Self-pay | Admitting: Student

## 2020-05-31 ENCOUNTER — Other Ambulatory Visit: Payer: Self-pay

## 2020-05-31 VITALS — BP 123/71 | HR 82 | Wt 150.0 lb

## 2020-05-31 DIAGNOSIS — N6001 Solitary cyst of right breast: Secondary | ICD-10-CM

## 2020-05-31 DIAGNOSIS — R234 Changes in skin texture: Secondary | ICD-10-CM | POA: Insufficient documentation

## 2020-05-31 DIAGNOSIS — Z3046 Encounter for surveillance of implantable subdermal contraceptive: Secondary | ICD-10-CM | POA: Diagnosis not present

## 2020-05-31 DIAGNOSIS — Z1239 Encounter for other screening for malignant neoplasm of breast: Secondary | ICD-10-CM

## 2020-05-31 DIAGNOSIS — Z3042 Encounter for surveillance of injectable contraceptive: Secondary | ICD-10-CM

## 2020-05-31 MED ORDER — MEDROXYPROGESTERONE ACETATE 150 MG/ML IM SUSP
150.0000 mg | Freq: Once | INTRAMUSCULAR | Status: AC
  Administered 2020-05-31: 150 mg via INTRAMUSCULAR

## 2020-05-31 NOTE — Progress Notes (Signed)
Patient ID: Diane Morton, female   DOB: 1991/04/29, 29 y.o.   MRN: [id number]    History:  Diane Morton is a 29 y.o. G1P0010 who presents to clinic today for evaluation for itching and breast masses.  Patient had bilateral nipple piercing in Sept , 2020. SHe reports no complaints for the first 8 months. Then, in April she started on Depo and noticed itching in her breasts and bilateral "cysts" on the aerola. She would pop them in the shower. When she would pop them in the shower; she would drain white pus. They would go away but then return.   She also reports itching in April that was "all over" -underside and radiating back to her arm pit. She used oils and stopped scratching and now it has gotten better; it got better in September. She denies any itching today.   Around Thanksgiving she noticed a lump that "moved". It hurt but it doesn't hurt now. She hasn't touched it or checked it since then.    The following portions of the patient's history were reviewed and updated as appropriate: allergies, current medications, family history, past medical history, social history, past surgical history and problem list.  Review of Systems:  Review of Systems  Constitutional: Negative.   HENT: Negative.   Respiratory: Negative.   Cardiovascular: Negative.   Genitourinary: Negative.   Musculoskeletal: Negative.   Skin: Positive for itching.      Objective:  Physical Exam BP 123/71   Pulse 82   Wt 150 lb (68 kg)   Breastfeeding No   BMI 24.21 kg/m  Physical Exam Chest:       Comments: Breasts are equal in size and symmetry, no lumps, masses, palpated in either breast or axiall. Present piercing in both nipples and adjacent to the piercing are two small pimples. No pus draining, no odor, non-tender.  Abdominal:     General: Abdomen is flat.  Musculoskeletal:        General: Normal range of motion.  Skin:    General: Skin is warm.  Neurological:     Mental Status: She is alert.        Labs and Imaging No results found for this or any previous visit (from the past 24 hour(s)).  No results found.   Assessment & Plan:   1. Encounter for screening breast examination    -educated patient that her nipples are probably irritated from piercing and that the small pimples are a side effect of that.  -No odor, no discharge or other signs of infection today. Described warning signs and when to seek care for possible abscess drainage (hard spots, spreading redness, fever, painful to touch).   -discussion with patient about whether or not take out piercing; s -No lumps, masses or other abnormalities on breast exam, advised patient to continue to do monthly exams and return to clinic if lump comes back.   Approximately 20 minutes of total time was spent with this patient on exam and counseling.   [place]arr [place], CNM 05/31/2020 11:15 AM

## 2020-05-31 NOTE — Progress Notes (Signed)
Reports breasts being "extremely itchy" over the last 4-5 months. Bilateral nipple piercings done 02/26/2019; spot on right nipple that has not healed completely since peircing. Nonhealing place fills and drains every few months, some flaky skin to nipple area during healing process. Noticed lump in right breast shortly before thanksgiving, lump is mobile. Has not felt breasts since because does not like to feel the lump.   Apolonio Schneiders RN 05/31/20

## 2020-08-16 ENCOUNTER — Ambulatory Visit (INDEPENDENT_AMBULATORY_CARE_PROVIDER_SITE_OTHER): Payer: Medicaid Other

## 2020-08-16 ENCOUNTER — Other Ambulatory Visit: Payer: Self-pay

## 2020-08-16 VITALS — BP 126/80 | HR 90 | Wt 154.4 lb

## 2020-08-16 DIAGNOSIS — Z3042 Encounter for surveillance of injectable contraceptive: Secondary | ICD-10-CM

## 2020-08-16 MED ORDER — MEDROXYPROGESTERONE ACETATE 150 MG/ML IM SUSP
150.0000 mg | Freq: Once | INTRAMUSCULAR | Status: AC
  Administered 2020-08-16: 150 mg via INTRAMUSCULAR

## 2020-08-16 NOTE — Progress Notes (Signed)
Elie Goody here for Depo-Provera Injection. Injection administered without complication. Patient will return in 3 months for next injection between 11/01/20 and 11/15/20. Next annual visit due 05/31/21. Pt reports spotting for the past month. Pt would like to stay on Depo Provera at this time. Encouraged pt to notify the office is spotting continues or returns later.  Annabell Howells, RN 08/16/2020  10:09 AM

## 2020-08-18 NOTE — Progress Notes (Signed)
I have reviewed this chart and agree with the RN/CMA assessment and management.    K. Meryl Leathie Weich, MD, FACOG Attending [facility])  

## 2020-10-06 ENCOUNTER — Other Ambulatory Visit: Payer: Self-pay

## 2020-10-06 ENCOUNTER — Encounter (HOSPITAL_COMMUNITY): Payer: Self-pay

## 2020-10-06 ENCOUNTER — Ambulatory Visit (HOSPITAL_COMMUNITY)
Admission: EM | Admit: 2020-10-06 | Discharge: 2020-10-06 | Disposition: A | Discharge status: Home / Self Care | Payer: Medicaid Other | Attending: Internal Medicine | Admitting: Internal Medicine

## 2020-10-06 DIAGNOSIS — Z3042 Encounter for surveillance of injectable contraceptive: Secondary | ICD-10-CM | POA: Insufficient documentation

## 2020-10-06 DIAGNOSIS — N898 Other specified noninflammatory disorders of vagina: Secondary | ICD-10-CM | POA: Insufficient documentation

## 2020-10-06 LAB — POCT URINALYSIS DIPSTICK, ED / UC
Bilirubin Urine: NEGATIVE
Glucose, UA: NEGATIVE mg/dL
Ketones, ur: NEGATIVE mg/dL
Leukocytes,Ua: NEGATIVE
Nitrite: NEGATIVE
Protein, ur: NEGATIVE mg/dL
Specific Gravity, Urine: 1.01 (ref 1.005–1.030)
Urobilinogen, UA: 0.2 mg/dL (ref 0.0–1.0)
pH: 6.5 (ref 5.0–8.0)

## 2020-10-06 LAB — POC URINE PREG, ED: Preg Test, Ur: NEGATIVE

## 2020-10-06 NOTE — Discharge Instructions (Addendum)
-  Try over the counter monistat cream for the next few days while we await results of your labs. If this comes back positive for anything, we'll call you and send treatment to your pharmacy.

## 2020-10-06 NOTE — ED Provider Notes (Signed)
[facility]    CSN: [id number] Arrival date & time: 10/06/20  3545      History   Chief Complaint Chief Complaint  Patient presents with  . Vaginal Itching    HPI Diane Morton is a 30 y.o. female presenting with external vaginal itching x1 week, worst around clitoral area.  Notes some urinary frequency but otherwise denies dysuria, vaginal discharge, vaginal rashes, vaginal lesions, urgency, back pain, fever/chills, STI risk.  Regularly uses boric acid suppositories to maintain vaginal health.  Does note that she has been trying a new fragranced soap for the last week, this is the only change in her routine.  HPI  Past Medical History:  Diagnosis Date  . Menorrhagia     Patient Active Problem List   Diagnosis Date Noted  . Breast skin changes 05/31/2020  . Depo-Provera contraceptive status 07/24/2019  . Birth control counseling 07/24/2019    Past Surgical History:  Procedure Laterality Date  . APPENDECTOMY      OB History    Gravida  1   Para  0   Term  0   Preterm  0   AB  1   Living  0     SAB  1   IAB  0   Ectopic  0   Multiple  0   Live Births               Home Medications    Prior to Admission medications   Not on File    Family History Family History  Problem Relation Age of Onset  . Asthma Mother     Social History Social History   Tobacco Use  . Smoking status: Never Smoker  . Smokeless tobacco: Never Used  Vaping Use  . Vaping Use: Never used  Substance Use Topics  . Alcohol use: No    Comment: last drink 06/04/19  . Drug use: No     Allergies   Latex   Review of Systems Review of Systems  Constitutional: Negative for appetite change, chills, diaphoresis and fever.  Respiratory: Negative for shortness of breath.   Cardiovascular: Negative for chest pain.  Gastrointestinal: Negative for abdominal pain, blood in stool, constipation, diarrhea, nausea and vomiting.  Genitourinary: Negative for  decreased urine volume, difficulty urinating, dysuria, flank pain, frequency, genital sores, hematuria, menstrual problem, pelvic pain, urgency, vaginal bleeding, vaginal discharge and vaginal pain.  Musculoskeletal: Negative for back pain.  Neurological: Negative for dizziness, weakness and light-headedness.  All other systems reviewed and are negative.    Physical Exam Triage Vital Signs ED Triage Vitals  Enc Vitals Group     BP 10/06/20 0937 132/81     Pulse Rate 10/06/20 0937 90     Resp 10/06/20 0937 17     Temp 10/06/20 0937 97.8 F (36.6 C)     Temp src --      SpO2 10/06/20 0937 100 %     Weight --      Height --      Head Circumference --      Peak Flow --      Pain Score 10/06/20 0936 0     Pain Loc --      Pain Edu? --      Excl. in [place]? --    No data found.  Updated Vital Signs BP 132/81   Pulse 90   Temp 97.8 F (36.6 C)   Resp 17   SpO2 100%  Visual Acuity Right Eye Distance:   Left Eye Distance:   Bilateral Distance:    Right Eye Near:   Left Eye Near:    Bilateral Near:     Physical Exam Vitals reviewed.  Constitutional:      General: She is not in acute distress.    Appearance: Normal appearance. She is not ill-appearing.  HENT:     Head: Normocephalic and atraumatic.     Mouth/Throat:     Mouth: Mucous membranes are moist.     Comments: Moist mucous membranes Eyes:     Extraocular Movements: Extraocular movements intact.     Pupils: Pupils are equal, round, and reactive to light.  Cardiovascular:     Rate and Rhythm: Normal rate and regular rhythm.     Heart sounds: Normal heart sounds.  Pulmonary:     Effort: Pulmonary effort is normal.     Breath sounds: Normal breath sounds. No wheezing, rhonchi or rales.  Abdominal:     General: Bowel sounds are normal. There is no distension.     Palpations: Abdomen is soft. There is no mass.     Tenderness: There is no abdominal tenderness. There is no right CVA tenderness, left CVA  tenderness, guarding or rebound.  Genitourinary:    Comments: deferred Skin:    General: Skin is warm.     Capillary Refill: Capillary refill takes less than 2 seconds.     Comments: Good skin turgor  Neurological:     General: No focal deficit present.     Mental Status: She is alert and oriented to person, place, and time.  Psychiatric:        Mood and Affect: Mood normal.        Behavior: Behavior normal.        Thought Content: Thought content normal.      UC Treatments / Results  Labs (all labs ordered are listed, but only abnormal results are displayed) Labs Reviewed  POCT URINALYSIS DIPSTICK, ED / UC - Abnormal; Notable for the following components:      Result Value   Hgb urine dipstick MODERATE (*)    All other components within normal limits  POC URINE PREG, ED    EKG   Radiology No results found.  Procedures Procedures (including critical care time)  Medications Ordered in UC Medications - No data to display  Initial Impression / Assessment and Plan / UC Course  I have reviewed the triage vital signs and the nursing notes.  Pertinent labs & imaging results that were available during my care of the patient were reviewed by me and considered in my medical decision making (see chart for details).     This patient is a 30 year old female presenting with external vaginal irritation.  No abdominal pain or CVAT, afebrile and nontachycardic.  Depo shot for contraception.  Urine pregnancy negative.  UA with blood (pt on her period) but otherwise wnl. Culture deferred.   Suspect symptoms are related to new fragranced soap.  Recommended stopping this and trying Monistat cream for the next few days as we await self swab results.  Will send for BV, yeast, gonorrhea, chlamydia, trichomonas.  Declines HIV, RPR.  ED return precautions discussed.   Final Clinical Impressions(s) / UC Diagnoses   Final diagnoses:  Vaginal irritation  Depo-Provera contraceptive  status     Discharge Instructions     -Try over the counter monistat cream for the next few days while we await results of your  labs. If this comes back positive for anything, we'll call you and send treatment to your pharmacy.   ED Prescriptions    None     PDMP not reviewed this encounter.   Hazel Sams, PA-C 10/06/20 1009

## 2020-10-06 NOTE — ED Triage Notes (Signed)
Pt in with c/o vaginal itching on her clitoris that has been going on for 1 week. Also c/o urinary frequency  Denies any vaginal discharge

## 2020-10-09 LAB — CERVICOVAGINAL ANCILLARY ONLY
Bacterial Vaginitis (gardnerella): NEGATIVE
Candida Glabrata: NEGATIVE
Candida Vaginitis: POSITIVE — AB
Chlamydia: NEGATIVE
Comment: NEGATIVE
Comment: NEGATIVE
Comment: NEGATIVE
Comment: NEGATIVE
Comment: NEGATIVE
Comment: NORMAL
Neisseria Gonorrhea: NEGATIVE
Trichomonas: NEGATIVE

## 2020-10-11 ENCOUNTER — Telehealth (HOSPITAL_COMMUNITY): Payer: Self-pay | Admitting: Emergency Medicine

## 2020-11-01 ENCOUNTER — Ambulatory Visit (INDEPENDENT_AMBULATORY_CARE_PROVIDER_SITE_OTHER): Payer: Medicaid Other

## 2020-11-01 ENCOUNTER — Other Ambulatory Visit: Payer: Self-pay

## 2020-11-01 VITALS — BP 118/79 | HR 82 | Wt 156.9 lb

## 2020-11-01 DIAGNOSIS — Z3042 Encounter for surveillance of injectable contraceptive: Secondary | ICD-10-CM | POA: Diagnosis not present

## 2020-11-01 MED ORDER — MEDROXYPROGESTERONE ACETATE 150 MG/ML IM SUSP
150.0000 mg | Freq: Once | INTRAMUSCULAR | Status: AC
  Administered 2020-11-01: 150 mg via INTRAMUSCULAR

## 2020-11-01 NOTE — Progress Notes (Signed)
Patient was assessed and managed by nursing staff during this encounter. I have reviewed the chart and agree with the documentation and plan.   Pt assessed, no nipple discharge present. No redness or irritation noted. Advised pt that mild intermittent itching likely due to healing process and to watch for s/sx of infection or nipple/breast changes. Pt verbalized understanding.  Gabriel Carina, CNM 11/01/2020 5:22 PM

## 2020-11-01 NOTE — Progress Notes (Signed)
Diane Morton here for Depo-Provera Injection. Injection administered without complication. Patient will return in 3 months for next injection between 01/17/21 and 01/31/21. Next annual visit due 05/31/21.   Pt reports removing nipple piercings February 2022. Then at the beginning of May noticed an uncomfortable bump forming on right nipple, denies pain. States that a few days later the she was able to express pus from nipple. Pt reports nipple appears normal at this time but complains of continued itching. Report given to Gaylan Gerold, CNM who comes to pt room to examine.  Annabell Howells, RN 11/01/2020  10:42 AM

## 2021-01-18 ENCOUNTER — Ambulatory Visit: Payer: Medicaid Other

## 2021-01-22 ENCOUNTER — Ambulatory Visit: Payer: Medicaid Other

## 2021-01-25 ENCOUNTER — Other Ambulatory Visit: Payer: Self-pay

## 2021-01-25 ENCOUNTER — Ambulatory Visit (INDEPENDENT_AMBULATORY_CARE_PROVIDER_SITE_OTHER): Payer: Self-pay

## 2021-01-25 VITALS — BP 113/83 | HR 98 | Ht 66.0 in | Wt 158.3 lb

## 2021-01-25 DIAGNOSIS — Z3042 Encounter for surveillance of injectable contraceptive: Secondary | ICD-10-CM

## 2021-01-25 MED ORDER — MEDROXYPROGESTERONE ACETATE 150 MG/ML IM SUSP
150.0000 mg | Freq: Once | INTRAMUSCULAR | Status: AC
  Administered 2021-01-25: 150 mg via INTRAMUSCULAR

## 2021-01-25 NOTE — Progress Notes (Signed)
Diane Morton here for Depo-Provera Injection. Injection administered without complication. Patient will return in 3 months for next injection between Oct 27 and Nov 10,2022. Next annual visit due Dec 2022.   Georgia Lopes, RN 01/25/2021  3:01 PM

## 2021-04-13 ENCOUNTER — Encounter: Payer: Self-pay | Admitting: *Deleted

## 2021-04-17 ENCOUNTER — Ambulatory Visit: Payer: Medicaid Other

## 2021-04-26 IMAGING — US US OB < 14 WEEKS - US OB TV
1 series · 15 of 28 positions shown · non-contrast
Comparison: None

CLINICAL DATA: Vaginal bleeding in first trimester of pregnancy,
cramping increased since last night, LMP 04/30/2019

EXAM:
OBSTETRIC <14 WK US AND TRANSVAGINAL OB US
TECHNIQUE: Both transabdominal and transvaginal ultrasound examinations were
performed for complete evaluation of the gestation as well as the
maternal uterus, adnexal regions, and pelvic cul-de-sac.
Transvaginal technique was performed to assess early pregnancy.

[Series 1: us ob < 14 weeks - us ob tv · 15 of 39 slices shown]
[im 1/39]
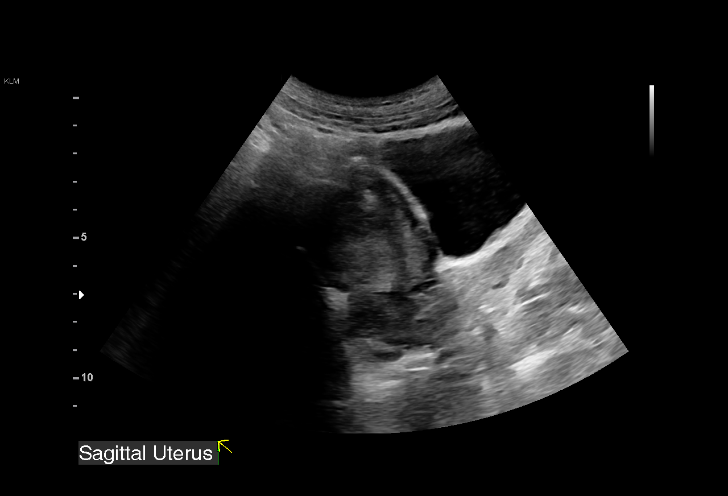
[im 3/39]
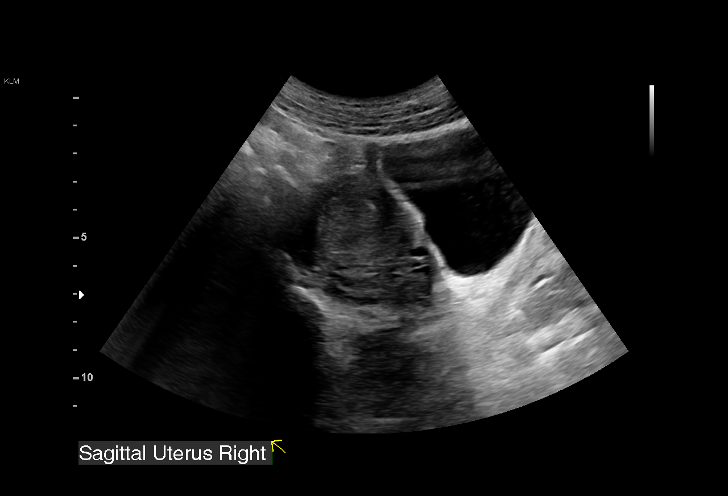
[im 6/39]
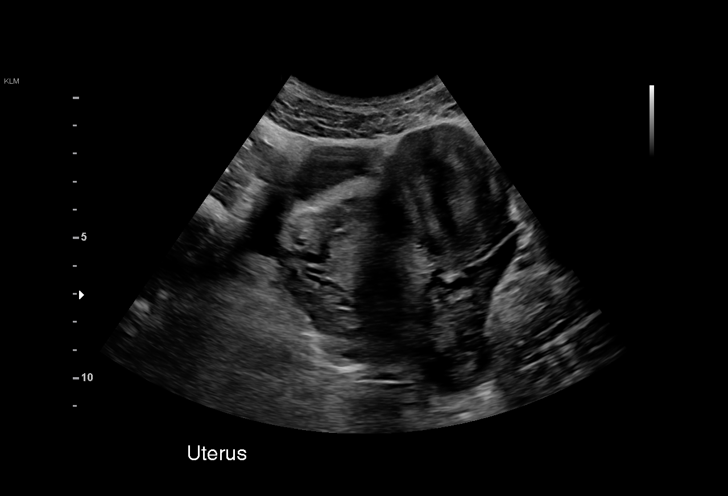
[im 9/39]
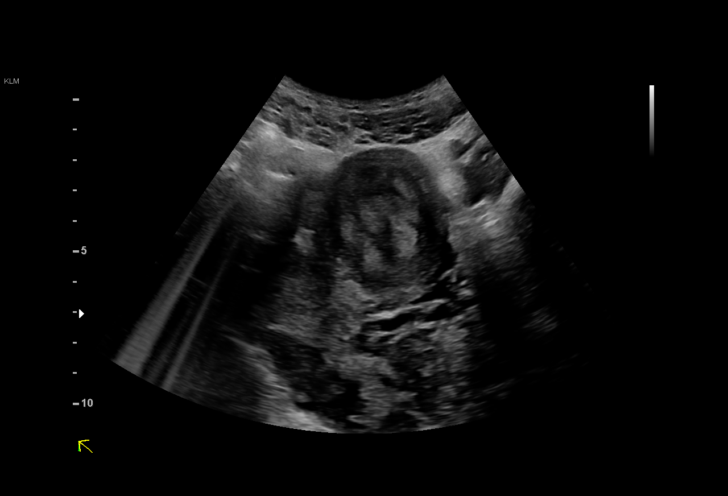
[im 12/39]
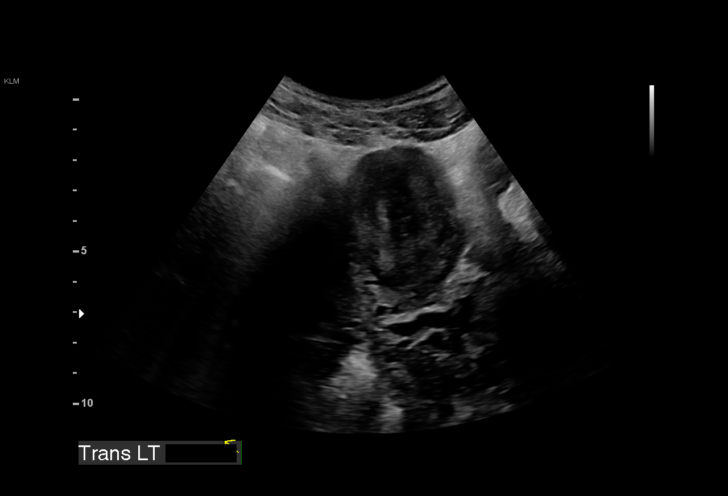
[im 15/39]
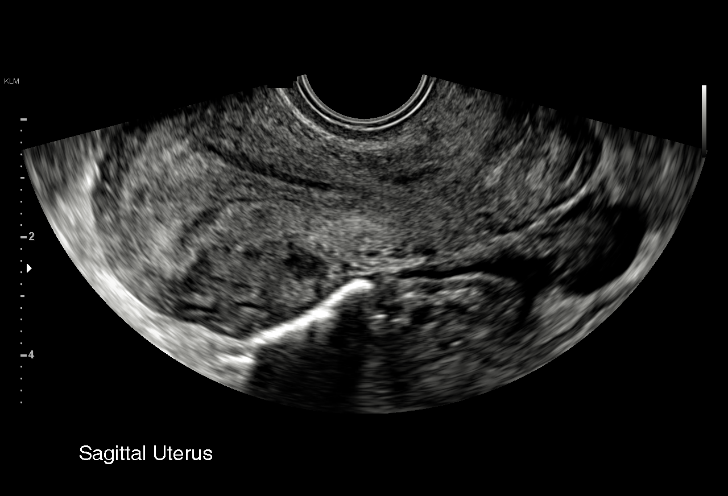
[im 17/39]
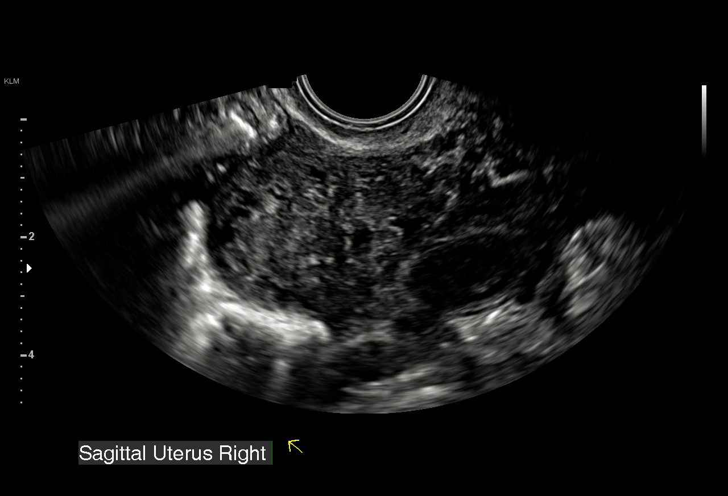
[im 20/39]
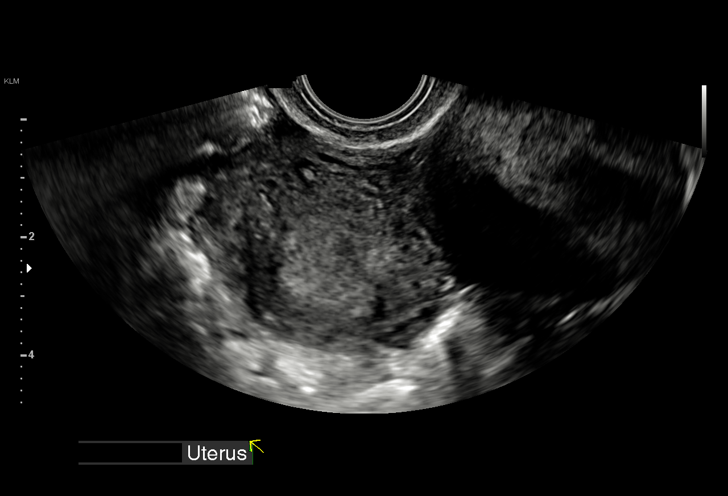
[im 22/39]
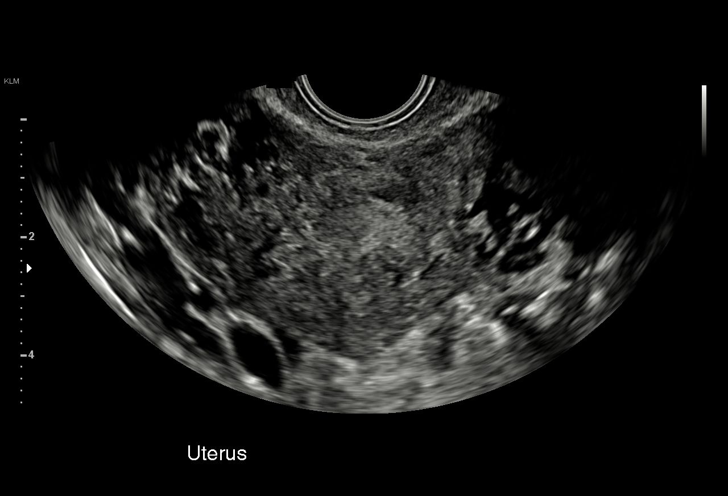
[im 24/39]
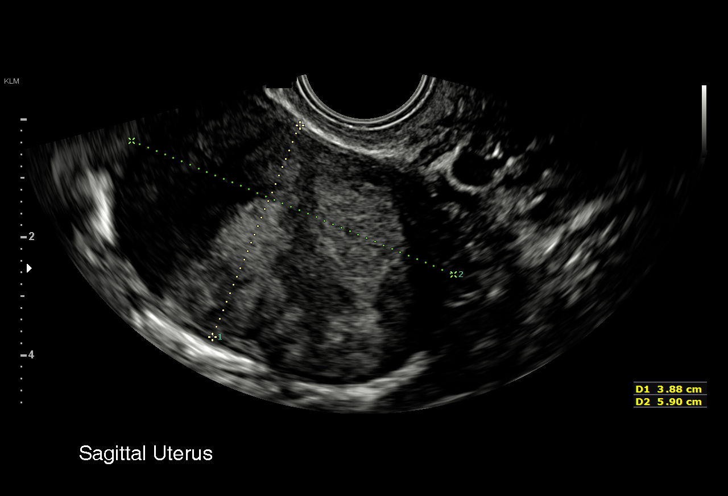
[im 27/39]
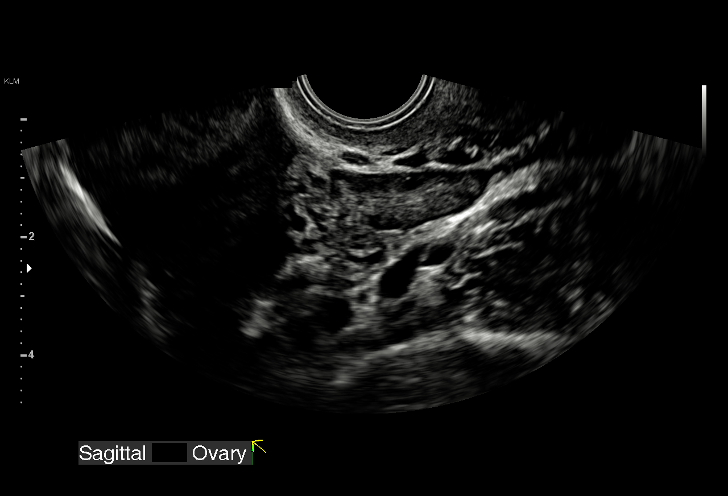
[im 30/39]
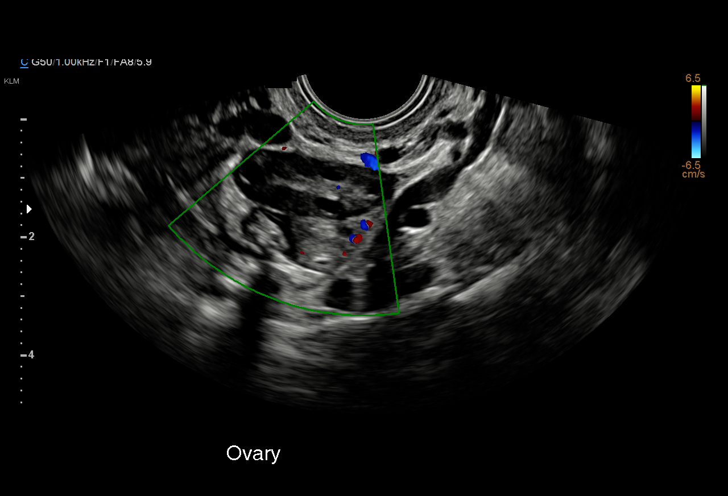
[im 33/39]
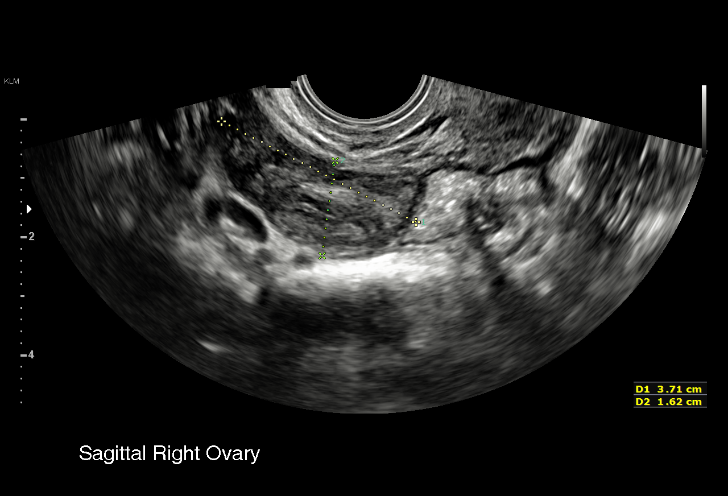
[im 36/39]
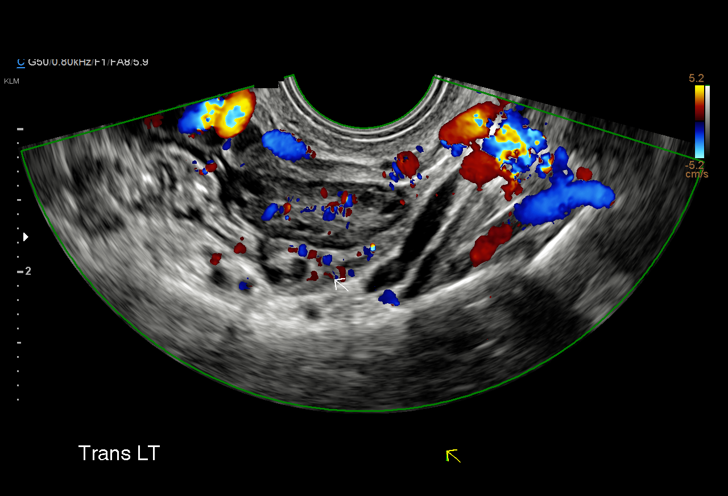
[im 39/39]
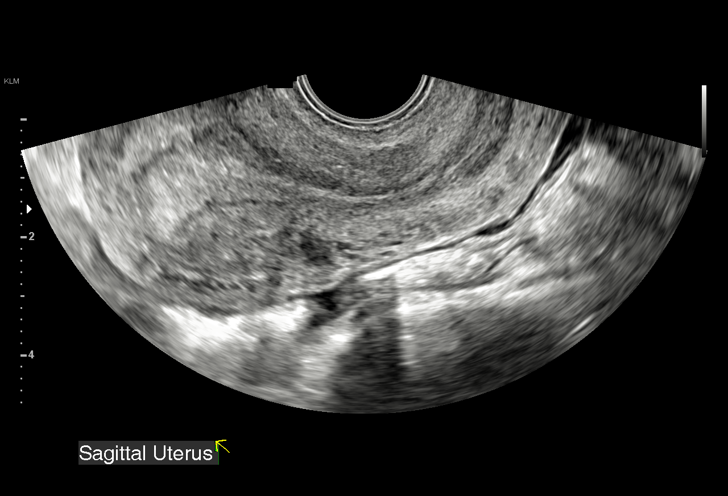

[15 of 28 positions shown; findings below may reference images not displayed]

FINDINGS: Intrauterine gestational sac: None identified

Yolk sac:  N/A

Embryo:  N/A

Cardiac Activity: N/A

Heart Rate: N/A  bpm

MSD:   mm    w     d

CRL:    mm    w    d                  US EDC:

Subchorionic hemorrhage:  N/A

Maternal uterus/adnexae:

Uterus anteverted, normal morphology.

Exophytic leiomyoma from LEFT uterus 3.9 x 5.9 x 5.1 cm.

Heterogeneous myometrium without additional mass.

Endometrial complex unremarkable without fluid or gestational sac.

RIGHT ovary normal size and morphology 3.7 x 1.6 x 1.6 cm.

LEFT ovary normal size and morphology 2.8 x 1.0 x 2.0 cm.

Trace free pelvic fluid.

No adnexal masses.
IMPRESSION: Large exophytic leiomyoma from LEFT uterus 5.9 cm greatest size.

No intrauterine gestation identified.

Findings are compatible with pregnancy of unknown location.

Differential diagnosis includes early intrauterine pregnancy too
early to visualize, spontaneous abortion, and ectopic pregnancy.

Serial quantitative beta hCG and or followup ultrasound recommended
to definitively exclude ectopic pregnancy.

## 2021-05-01 ENCOUNTER — Ambulatory Visit: Payer: Medicaid Other

## 2021-05-07 ENCOUNTER — Ambulatory Visit (INDEPENDENT_AMBULATORY_CARE_PROVIDER_SITE_OTHER): Payer: Self-pay

## 2021-05-07 VITALS — BP 129/77 | HR 92 | Wt 162.4 lb

## 2021-05-07 DIAGNOSIS — Z3202 Encounter for pregnancy test, result negative: Secondary | ICD-10-CM

## 2021-05-07 DIAGNOSIS — Z3042 Encounter for surveillance of injectable contraceptive: Secondary | ICD-10-CM

## 2021-05-07 LAB — POCT PREGNANCY, URINE: Preg Test, Ur: NEGATIVE

## 2021-05-07 MED ORDER — MEDROXYPROGESTERONE ACETATE 150 MG/ML IM SUSP
150.0000 mg | Freq: Once | INTRAMUSCULAR | Status: AC
  Administered 2021-05-07: 150 mg via INTRAMUSCULAR

## 2021-05-07 NOTE — Progress Notes (Addendum)
Diane Morton here for Depo-Provera Injection. Patient late to receive injection. Patient endorses not having sexual intercourse within the last two weeks. UPT negative. Injection administered without complication. Patient will return in 3 months for next injection between Feb 6 and Feb 20. Next annual visit due with next Depo Provera injection.  Seth Bake, RN 05/07/2021  2:05 PM

## 2021-05-27 NOTE — Progress Notes (Signed)
Patient was assessed and managed by nursing staff during this encounter. I have reviewed the chart and agree with the documentation and plan. I have also made any necessary editorial changes.  Aletha Halim, MD 05/27/2021 9:12 PM

## 2021-07-30 ENCOUNTER — Ambulatory Visit: Payer: Medicaid Other

## 2021-08-10 ENCOUNTER — Ambulatory Visit (INDEPENDENT_AMBULATORY_CARE_PROVIDER_SITE_OTHER): Payer: Self-pay | Admitting: *Deleted

## 2021-08-10 ENCOUNTER — Other Ambulatory Visit: Payer: Self-pay

## 2021-08-10 VITALS — BP 123/77 | HR 82 | Ht 66.0 in | Wt 165.3 lb

## 2021-08-10 DIAGNOSIS — Z3042 Encounter for surveillance of injectable contraceptive: Secondary | ICD-10-CM

## 2021-08-10 LAB — POCT PREGNANCY, URINE: Preg Test, Ur: NEGATIVE

## 2021-08-10 MED ORDER — MEDROXYPROGESTERONE ACETATE 150 MG/ML IM SUSP
150.0000 mg | Freq: Once | INTRAMUSCULAR | Status: AC
  Administered 2021-08-10: 150 mg via INTRAMUSCULAR

## 2021-08-10 NOTE — Progress Notes (Signed)
Patient here for depo-provera . Patient last had depo-provera 18 weeks ago on 05/07/21. Per protocol I asked when she last had unprotected intercourse . She confirms was 1 1/2 weeks ago. Per protocol I informed her I cannot give her the depo-provera today. I Informed her she should abstain from intercourse or have protected intercourse and reschedule depo-provera injection in 1-2 weeks. She voices understanding. Staci Acosta Addendum: Patient c/o and requested review of chart and to receive depo-provera. Per review it was 13 weeks and 4 days since last depo-provera , per protocol upt collected which was negative. Depo-provera given without complaint. Advised to use back up protection 2 weeks. Advised must get annual exam before she can get next injection. She voices understanding. Staci Acosta

## 2021-08-12 NOTE — Progress Notes (Signed)
Chart reviewed for nurse visit. Agree with plan of care.   Clarnce Flock, MD 08/12/21 8:10 AM

## 2021-10-26 ENCOUNTER — Ambulatory Visit (INDEPENDENT_AMBULATORY_CARE_PROVIDER_SITE_OTHER): Payer: Medicaid Other | Admitting: Family Medicine

## 2021-10-26 ENCOUNTER — Encounter: Payer: Self-pay | Admitting: Family Medicine

## 2021-10-26 ENCOUNTER — Other Ambulatory Visit (HOSPITAL_COMMUNITY)
Admission: RE | Admit: 2021-10-26 | Discharge: 2021-10-26 | Disposition: A | Discharge status: Home / Self Care | Payer: Medicaid Other | Source: Ambulatory Visit | Attending: Family Medicine | Admitting: Family Medicine

## 2021-10-26 VITALS — BP 127/80 | HR 103 | Ht 66.0 in | Wt 166.5 lb

## 2021-10-26 DIAGNOSIS — Z113 Encounter for screening for infections with a predominantly sexual mode of transmission: Secondary | ICD-10-CM | POA: Diagnosis not present

## 2021-10-26 DIAGNOSIS — Z0000 Encounter for general adult medical examination without abnormal findings: Secondary | ICD-10-CM

## 2021-10-26 DIAGNOSIS — Z3042 Encounter for surveillance of injectable contraceptive: Secondary | ICD-10-CM

## 2021-10-26 DIAGNOSIS — Z01419 Encounter for gynecological examination (general) (routine) without abnormal findings: Secondary | ICD-10-CM

## 2021-10-26 LAB — POCT PREGNANCY, URINE: Preg Test, Ur: NEGATIVE

## 2021-10-26 MED ORDER — MEDROXYPROGESTERONE ACETATE 150 MG/ML IM SUSP
150.0000 mg | Freq: Once | INTRAMUSCULAR | Status: AC
  Administered 2021-10-26: 150 mg via INTRAMUSCULAR

## 2021-10-26 NOTE — Progress Notes (Addendum)
Subjective:  ? ? Diane Morton is a 31 y.o. female who presents for an annual exam. The patient has no complaints today. She is interested in depo-provera shot today. She does report weight gain of 27lb since starting depo and 3-4 weeks of spotting when she is almost due for her next shot. Her last shot was 08/10/2021. She denies having unprotected sex within the last 2 weeks.The patient is sexually active and feels safe in her relationship with her boyfriend. Patient reports that she and her boyfriend are planning to start trying to conceive in one year.  ? ?GYN screening history: last pap: approximate date 10/07/2019 and was normal. The patient reports that there is not domestic violence in her life. Patient reports an episode of depressed mood in Sep 12, 2020 after death of a family member which she feels is now resolved, and denies current suicidal ideation, denies changes in sleep, interest, appetite.  ? ?No known drug allergies. Is taking "Maca Panax Ginseng" supplement, takes no medications.  ? ?Menstrual History: ?OB History   ? ? Gravida  ?1  ? Para  ?0  ? Term  ?0  ? Preterm  ?0  ? AB  ?1  ? Living  ?0  ?  ? ? SAB  ?1  ? IAB  ?0  ? Ectopic  ?0  ? Multiple  ?0  ? Live Births  ?   ?   ?  ?  ?  ?Menarche: 31 years old ?No LMP recorded. Patient has had an injection. ?  ? ?The following portions of the patient's history were reviewed and updated as appropriate: allergies, current medications, past family history, past medical history, past social history, past surgical history, and problem list. ? ?Review of Systems ?A comprehensive review of systems was negative except for: Constitutional: positive for weight gain.  ?  ?Objective:  ? ? General appearance: alert, cooperative, appears stated age, and no distress ?Head: Normocephalic, without obvious abnormality, atraumatic ?Lungs: clear to auscultation bilaterally ?Breasts: normal appearance, no masses or tenderness, Normal to palpation without dominant  masses ?Heart: regular rate and rhythm, S1, S2 normal, no murmur, click, rub or gallop ?Abdomen: soft, non-tender; bowel sounds normal; no masses,  no organomegaly ?Skin: Skin color, texture, turgor normal. No rashes or lesions ?Neurologic: Mental status: Alert, oriented, thought content appropriate.  ?  ?Assessment:  ? ? Healthy female exam. Patient counseled today on taking prenatal vitamins before trying to conceive. Different birth control options were reviewed today but she did not express interest in pills, patches, nexplanon, or IUDs.     ?  ?Plan:  ? Screen for STD (sexually transmitted disease) - Plan: HIV Antibody (routine testing w rflx), RPR, Hepatitis B Surface AntiGEN, Hepatitis C antibody, Cervicovaginal ancillary only( [facility]) ? ?Encounter for Depo-Provera contraception - thoroughly reviewed all options, plans to coninue Depo--usual 6-9 months befroe resumption of ovulation discussed. - Plan: Pregnancy, urine POC, medroxyPROGESTERone (DEPO-PROVERA) injection 150 mg ? ?Encounter for gynecological examination without abnormal finding ? ?Return in about 3 months (around 01/26/2022) for Depo Provera. ? ?  ?

## 2021-10-27 LAB — RPR: RPR Ser Ql: NONREACTIVE

## 2021-10-27 LAB — HEPATITIS B SURFACE ANTIGEN: Hepatitis B Surface Ag: NEGATIVE

## 2021-10-27 LAB — HIV ANTIBODY (ROUTINE TESTING W REFLEX): HIV Screen 4th Generation wRfx: NONREACTIVE

## 2021-10-27 LAB — HEPATITIS C ANTIBODY: Hep C Virus Ab: NONREACTIVE

## 2021-10-28 NOTE — Progress Notes (Signed)
?  See medical student note ?

## 2021-10-29 LAB — CERVICOVAGINAL ANCILLARY ONLY
Chlamydia: NEGATIVE
Comment: NEGATIVE
Comment: NEGATIVE
Comment: NORMAL
Neisseria Gonorrhea: NEGATIVE
Trichomonas: NEGATIVE
